# Patient Record
Sex: Male | Born: 1970 | Race: White | Hispanic: No | Marital: Married | Smoking: Former smoker
Health system: Southern US, Community
[De-identification: ages and names within clinical notes are randomized; demographics above are authoritative.]

## PROBLEM LIST (undated history)

## (undated) DIAGNOSIS — K449 Diaphragmatic hernia without obstruction or gangrene: Secondary | ICD-10-CM

## (undated) DIAGNOSIS — R7989 Other specified abnormal findings of blood chemistry: Secondary | ICD-10-CM

## (undated) DIAGNOSIS — IMO0001 Reserved for inherently not codable concepts without codable children: Secondary | ICD-10-CM

## (undated) DIAGNOSIS — F17200 Nicotine dependence, unspecified, uncomplicated: Secondary | ICD-10-CM

## (undated) DIAGNOSIS — E785 Hyperlipidemia, unspecified: Secondary | ICD-10-CM

## (undated) DIAGNOSIS — K219 Gastro-esophageal reflux disease without esophagitis: Secondary | ICD-10-CM

## (undated) DIAGNOSIS — M10071 Idiopathic gout, right ankle and foot: Secondary | ICD-10-CM

## (undated) DIAGNOSIS — I10 Essential (primary) hypertension: Secondary | ICD-10-CM

## (undated) DIAGNOSIS — E559 Vitamin D deficiency, unspecified: Secondary | ICD-10-CM

## (undated) HISTORY — DX: Gastro-esophageal reflux disease without esophagitis: K21.9

## (undated) HISTORY — DX: Reserved for inherently not codable concepts without codable children: IMO0001

## (undated) HISTORY — DX: Idiopathic gout, right ankle and foot: M10.071

## (undated) HISTORY — DX: Hyperlipidemia, unspecified: E78.5

## (undated) HISTORY — DX: Other specified abnormal findings of blood chemistry: R79.89

## (undated) HISTORY — DX: Diaphragmatic hernia without obstruction or gangrene: K44.9

## (undated) HISTORY — DX: Essential (primary) hypertension: I10

## (undated) HISTORY — DX: Vitamin D deficiency, unspecified: E55.9

## (undated) HISTORY — DX: Nicotine dependence, unspecified, uncomplicated: F17.200

---

## 2003-03-22 ENCOUNTER — Emergency Department (HOSPITAL_COMMUNITY): Admission: EM | Admit: 2003-03-22 | Discharge: 2003-03-22 | Payer: Self-pay | Admitting: Emergency Medicine

## 2017-12-03 ENCOUNTER — Other Ambulatory Visit (HOSPITAL_COMMUNITY): Payer: Self-pay | Admitting: Physician Assistant

## 2017-12-03 DIAGNOSIS — R0602 Shortness of breath: Secondary | ICD-10-CM

## 2017-12-10 ENCOUNTER — Other Ambulatory Visit: Payer: Self-pay

## 2017-12-10 ENCOUNTER — Ambulatory Visit (HOSPITAL_COMMUNITY): Payer: 59 | Attending: Cardiovascular Disease

## 2017-12-10 ENCOUNTER — Encounter (INDEPENDENT_AMBULATORY_CARE_PROVIDER_SITE_OTHER): Payer: Self-pay

## 2017-12-10 DIAGNOSIS — R0602 Shortness of breath: Secondary | ICD-10-CM | POA: Diagnosis present

## 2018-02-25 ENCOUNTER — Encounter: Payer: Self-pay | Admitting: Cardiology

## 2018-02-26 ENCOUNTER — Encounter (INDEPENDENT_AMBULATORY_CARE_PROVIDER_SITE_OTHER): Payer: Self-pay

## 2018-02-26 ENCOUNTER — Ambulatory Visit (INDEPENDENT_AMBULATORY_CARE_PROVIDER_SITE_OTHER): Payer: 59 | Admitting: Cardiology

## 2018-02-26 ENCOUNTER — Encounter: Payer: Self-pay | Admitting: Cardiology

## 2018-02-26 VITALS — BP 130/90 | HR 94 | Ht 67.0 in | Wt 177.4 lb

## 2018-02-26 DIAGNOSIS — R9431 Abnormal electrocardiogram [ECG] [EKG]: Secondary | ICD-10-CM

## 2018-02-26 DIAGNOSIS — I10 Essential (primary) hypertension: Secondary | ICD-10-CM | POA: Diagnosis not present

## 2018-02-26 DIAGNOSIS — R0600 Dyspnea, unspecified: Secondary | ICD-10-CM | POA: Diagnosis not present

## 2018-02-26 DIAGNOSIS — R079 Chest pain, unspecified: Secondary | ICD-10-CM | POA: Diagnosis not present

## 2018-02-26 DIAGNOSIS — Z87891 Personal history of nicotine dependence: Secondary | ICD-10-CM

## 2018-02-26 MED ORDER — METOPROLOL TARTRATE 100 MG PO TABS: 100.0000 mg | ORAL_TABLET | Freq: Once | ORAL | 0 refills | Status: DC

## 2018-02-26 NOTE — Patient Instructions (Addendum)
Medication Instructions:  Your physician recommends that you continue on your current medications as directed. Please refer to the Current Medication list given to you today.  Labwork: Your physician recommends that you return for lab work a few days before your cardiac CT is performed.  Testing/Procedures: Cardiac CT  Non-Cardiac CT scanning, (CAT scanning), is a noninvasive, special x-ray that produces cross-sectional images of the body using x-rays and a computer. CT scans help physicians diagnose and treat medical conditions. For some CT exams, a contrast material is used to enhance visibility in the area of the body being studied. CT scans provide greater clarity and reveal more details than regular x-ray exams.   Follow-Up: Your physician recommends that you schedule a follow-up appointment as needed with Dr Anne Fu  Any Other Special Instructions Will Be Listed Below (If Applicable).     If you need a refill on your cardiac medications before your next appointment, please call your pharmacy.   Please arrive at the [facility] main entrance of [facility] at ____________________ AM/PM  [facility] [address] [place], [place] [address] [phone]  Proceed to the [facility] Radiology Department (First Floor).  Please follow these instructions carefully (unless otherwise directed):  Hold all erectile dysfunction medications at least 48 hours prior to test.  On the Night Before the Test: . Be sure to Drink plenty of water. . Do not consume any caffeinated/decaffeinated beverages or chocolate 12 hours prior to your test. . Do not take any antihistamines 12 hours prior to your test.   On the Day of the Test: . Drink plenty of water. Do not drink any water within one hour of the test. . Do not eat any food 4 hours prior to the test. . You may take your regular medications prior to the test.  . Take metoprolol (Lopressor), 100mg  tablet, two hours  prior to test.      After the Test: . Drink plenty of water. . After receiving IV contrast, you may experience a mild flushed feeling. This is normal. . On occasion, you may experience a mild rash up to 24 hours after the test. This is not dangerous. If this occurs, you can take Benadryl 25 mg and increase your fluid intake. . If you experience trouble breathing, this can be serious. If it is severe call 911 IMMEDIATELY. If it is mild, please call our office.

## 2018-02-26 NOTE — Progress Notes (Signed)
Cardiology Office Note:    Date:  02/26/2018   ID:  Blayke Hlavka, DOB Mar 01, 1970, MRN [id number]  PCP:  Heide Scales, PA-C  Cardiologist:  No primary care provider on file.  Electrophysiologist:  None   Referring MD: Kathi Der, MD     History of Present Illness:    Brent Carson is a 48 y.o. male here for the evaluation of dyspnea at the request of Dr. Levora Angel.   He has been experiencing dyspnea off and on.  Hemoglobin was 14.4.  Father had a stroke.  Former smoker quit in 2019.  Used to smoke about 1-1/2 packs/week.  Occasional discomfort in his left shoulder.  SOB, has to stop and gather himself. With or without exertion. Once per day. Going to mail box. [place].   Odd, maybe tightness. Stopped in store had to breath, subside. Duration 1-5 - 10.  minutes.  Syncope 8 years ago x 1. Outdoor music, bright sensation.   Overall he is concerned about the symptoms.  Past Medical History:  Diagnosis Date  . Chronic GERD   . Gastroesophageal reflux disease without esophagitis   . Hiatal hernia   . Hyperlipidemia   . Hypertension   . Idiopathic gout, right ankle and foot   . Low testosterone   . Reflux   . Tobacco dependence   . Vitamin D deficiency     No past surgical history on file.  Current Medications: No outpatient medications have been marked as taking for the 02/26/18 encounter (Office Visit) with Brent Bathe, MD.     Allergies:   Simcor [niacin-simvastatin er]   Social History   Socioeconomic History  . Marital status: Married    Spouse name: Not on file  . Number of children: Not on file  . Years of education: Not on file  . Highest education level: Not on file  Occupational History  . Occupation: [employer]  Social Needs  . Financial resource strain: Not on file  . Food insecurity:    Worry: Not on file    Inability: Not on file  . Transportation needs:    Medical: Not on file    Non-medical: Not on file  Tobacco Use  . Smoking  status: Former Games developer  . Smokeless tobacco: Never Used  Substance and Sexual Activity  . Alcohol use: Yes    Comment: 2 per week  . Drug use: No  . Sexual activity: Not on file  Lifestyle  . Physical activity:    Days per week: Not on file    Minutes per session: Not on file  . Stress: Not on file  Relationships  . Social connections:    Talks on phone: Not on file    Gets together: Not on file    Attends religious service: Not on file    Active member of club or organization: Not on file    Attends meetings of clubs or organizations: Not on file    Relationship status: Not on file  Other Topics Concern  . Not on file  Social History Narrative  . Not on file     Family History: The patient's family history includes CVA in his father; Gout in his father; Hypertension in his father. There is no history of Colon cancer, Colon polyps, or Liver disease.  ROS:   Please see the history of present illness.    Does not eat well he states.  Has exhaustion at times.  Wakes up, short of breath.  All other systems reviewed and are negative.  EKGs/Labs/Other Studies Reviewed:    The following studies were reviewed today: Prior office notes lab work EKG  EKG:  EKG is  ordered today.  The ekg ordered today demonstrates 02/26/2018-sinus rhythm 94 inferior Q waves noted T wave inversion noted in V3 V4 V5 possible anterolateral ischemia.  Recent Labs: No results found for requested labs within last 8760 hours.  Recent Lipid Panel No results found for: CHOL, TRIG, HDL, CHOLHDL, VLDL, LDLCALC, LDLDIRECT  Physical Exam:    VS:  BP 130/90   Pulse 94   Ht 5\' 7"  (1.702 m)   Wt 177 lb 6.4 oz (80.5 kg)   BMI 27.78 kg/m     Wt Readings from Last 3 Encounters:  02/26/18 177 lb 6.4 oz (80.5 kg)     GEN:  Well nourished, well developed in no acute distress HEENT: Normal NECK: No JVD; No carotid bruits LYMPHATICS: No lymphadenopathy CARDIAC: RRR, no murmurs, rubs, gallops RESPIRATORY:   Clear to auscultation without rales, wheezing or rhonchi  ABDOMEN: Soft, non-tender, non-distended MUSCULOSKELETAL:  No edema; No deformity  SKIN: Warm and dry NEUROLOGIC:  Alert and oriented x 3 PSYCHIATRIC:  Normal affect   ASSESSMENT:    1. Dyspnea, unspecified type   2. Chest pain, unspecified type   3. Abnormal EKG   4. Essential hypertension   5. Former smoker    PLAN:    In order of problems listed above:  Dyspnea -may be an anginal equivalent especially with longstanding smoking history, recently quit.  He is concerned about these sensations.  I think it would make sense for [place] to go ahead and proceed with coronary CT with FFR analysis.  He has had an echocardiogram that showed normal ejection fraction.  Blood pressure diastolic mildly elevated.  Continue to treat.   Hyperlipidemia - Atorvastatin 20 mg currently.  Prevention.  Essential hypertension - On benazepril.  Continue work on weight loss.  Diet.  Diastolic elevated today.  Abnormal EKG - T wave inversions noted in the precordial leads.  Inferior Q waves noted however there is no evidence of this on echocardiogram.  Former smoker - This sensation of dyspnea also was accompanying some vaping use at the time.  No longer smoking, no longer vaping.   Medication Adjustments/Labs and Tests Ordered: Current medicines are reviewed at length with the patient today.  Concerns regarding medicines are outlined above.  Orders Placed This Encounter  Procedures  . CT CORONARY MORPH W/CTA COR W/SCORE W/CA W/CM &/OR WO/CM  . CT CORONARY FRACTIONAL FLOW RESERVE DATA PREP  . CT CORONARY FRACTIONAL FLOW RESERVE FLUID ANALYSIS  . Basic metabolic panel  . EKG 12-Lead   Meds ordered this encounter  Medications  . metoprolol tartrate (LOPRESSOR) 100 MG tablet    Sig: Take 1 tablet (100 mg total) by mouth once for 1 dose. Take 2 hours prior to your cardiac CT scan    Dispense:  1 tablet    Refill:  0    Patient  Instructions  Medication Instructions:  Your physician recommends that you continue on your current medications as directed. Please refer to the Current Medication list given to you today.  Labwork: Your physician recommends that you return for lab work a few days before your cardiac CT is performed.  Testing/Procedures: Cardiac CT  Non-Cardiac CT scanning, (CAT scanning), is a noninvasive, special x-ray that produces cross-sectional images of the body using x-rays and a computer. CT scans help  physicians diagnose and treat medical conditions. For some CT exams, a contrast material is used to enhance visibility in the area of the body being studied. CT scans provide greater clarity and reveal more details than regular x-ray exams.   Follow-Up: Your physician recommends that you schedule a follow-up appointment as needed with Dr Anne FuSkains  Any Other Special Instructions Will Be Listed Below (If Applicable).     If you need a refill on your cardiac medications before your next appointment, please call your pharmacy.   Please arrive at the [facility] main entrance of [facility] at ____________________ AM/PM  [facility] [address] [place], [place] [address] [phone]  Proceed to the [facility] Radiology Department (First Floor).  Please follow these instructions carefully (unless otherwise directed):  Hold all erectile dysfunction medications at least 48 hours prior to test.  On the Night Before the Test: . Be sure to Drink plenty of water. . Do not consume any caffeinated/decaffeinated beverages or chocolate 12 hours prior to your test. . Do not take any antihistamines 12 hours prior to your test.   On the Day of the Test: . Drink plenty of water. Do not drink any water within one hour of the test. . Do not eat any food 4 hours prior to the test. . You may take your regular medications prior to the test.  . Take metoprolol (Lopressor), 100mg   tablet, two hours prior to test.      After the Test: . Drink plenty of water. . After receiving IV contrast, you may experience a mild flushed feeling. This is normal. . On occasion, you may experience a mild rash up to 24 hours after the test. This is not dangerous. If this occurs, you can take Benadryl 25 mg and increase your fluid intake. . If you experience trouble breathing, this can be serious. If it is severe call 911 IMMEDIATELY. If it is mild, please call our office.     Signed, Donato SchultzMark Dannis Deroche, MD  02/26/2018 4:38 PM    [facility]

## 2018-03-24 ENCOUNTER — Other Ambulatory Visit: Payer: 59 | Admitting: *Deleted

## 2018-03-24 DIAGNOSIS — R079 Chest pain, unspecified: Secondary | ICD-10-CM

## 2018-03-24 DIAGNOSIS — R0600 Dyspnea, unspecified: Secondary | ICD-10-CM

## 2018-03-25 ENCOUNTER — Telehealth (HOSPITAL_COMMUNITY): Payer: Self-pay | Admitting: Emergency Medicine

## 2018-03-25 LAB — BASIC METABOLIC PANEL
BUN/Creatinine Ratio: 16 (ref 9–20)
BUN: 15 mg/dL (ref 6–24)
CO2: 22 mmol/L (ref 20–29)
Calcium: 9.6 mg/dL (ref 8.7–10.2)
Chloride: 97 mmol/L (ref 96–106)
Creatinine, Ser: 0.91 mg/dL (ref 0.76–1.27)
GFR calc Af Amer: 115 mL/min/{1.73_m2} (ref >59)
GFR calc non Af Amer: 99 mL/min/{1.73_m2} (ref >59)
Glucose: 89 mg/dL (ref 65–99)
POTASSIUM: 4.5 mmol/L (ref 3.5–5.2)
Sodium: 138 mmol/L (ref 134–144)

## 2018-03-25 NOTE — Telephone Encounter (Signed)
Left message on voicemail with name and callback number Gabrian Hoque RN Navigator Cardiac Imaging [facility] [phone] Office [phone] Cell  

## 2018-03-26 ENCOUNTER — Telehealth (HOSPITAL_COMMUNITY): Payer: Self-pay | Admitting: Emergency Medicine

## 2018-03-26 NOTE — Telephone Encounter (Signed)
Returning call after voicemail left--  pt verbalizes understanding of appt date/time, parking situation and where to check in, pre-test NPO status and medications ordered, and verified current allergies; name and call back number provided for further questions should they arise Rockwell Alexandria RN Navigator Cardiac Imaging [facility] [phone] office [phone] cell

## 2018-03-27 ENCOUNTER — Encounter (HOSPITAL_COMMUNITY): Payer: Self-pay

## 2018-03-27 ENCOUNTER — Ambulatory Visit (HOSPITAL_COMMUNITY)
Admission: RE | Admit: 2018-03-27 | Discharge: 2018-03-27 | Disposition: A | Discharge status: Home / Self Care | Payer: 59 | Source: Ambulatory Visit | Attending: Cardiology | Admitting: Cardiology

## 2018-03-27 DIAGNOSIS — R0600 Dyspnea, unspecified: Secondary | ICD-10-CM | POA: Diagnosis not present

## 2018-03-27 DIAGNOSIS — R079 Chest pain, unspecified: Secondary | ICD-10-CM | POA: Diagnosis not present

## 2018-03-27 IMAGING — CT CT HEART MORP W/ CTA COR W/ SCORE W/ CA W/CM &/OR W/O CM
4 of 7 series · 8 of 20 positions shown, 9 images · IV contrast (APPLIED)
Comparison: None.

Addendum:
EXAM:
OVER-READ INTERPRETATION  CT CHEST

The following report is an over-read performed by radiologist Dr.
Lomacenko Boaghe [REDACTED] on 03/28/2018. This
over-read does not include interpretation of cardiac or coronary
anatomy or pathology. The coronary CTA interpretation by the
cardiologist is attached.
CLINICAL DATA: Chest pain
Cardiac CTA
MEDICATIONS:
Sub lingual nitro. 4mg x 2
TECHNIQUE: The patient was scanned on a Siemens [REDACTED]ice scanner. Gantry
rotation speed was 250 msecs. Collimation was 0.6 mm. A 100 kV
prospective scan was triggered in the ascending thoracic aorta at
35-75% of the R-R interval. Average HR during the scan was 60 bpm.
The 3D data set was interpreted on a dedicated work station using
MPR, MIP and VRT modes. A total of 80cc of contrast was used.

[Series 6: best diast 74 % · axial · 0.41mm/px · z∈[-170,-130]mm · 2 of 301 slices shown, 3 images]
[im 101/301  vessel]
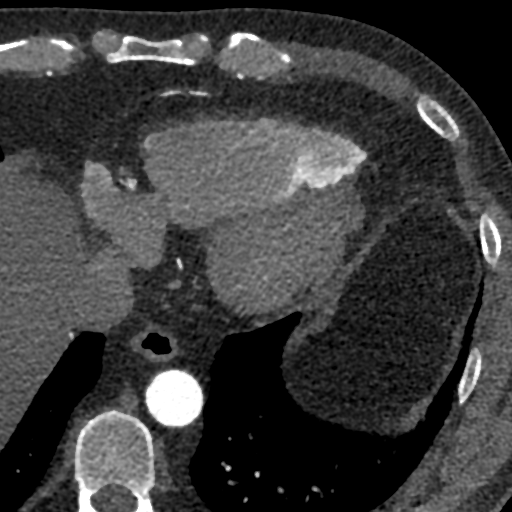
[im 101/301  lung]
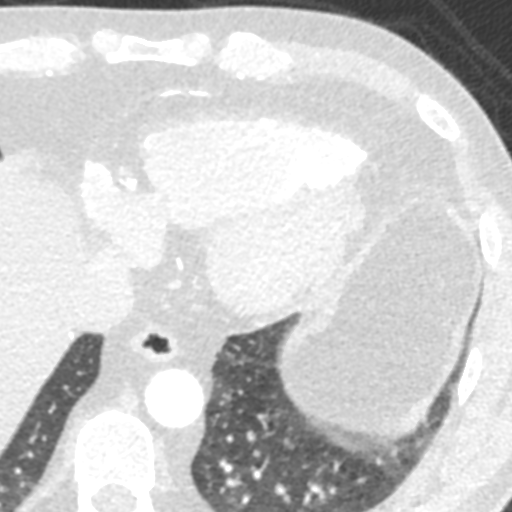
[im 201/301  vessel]
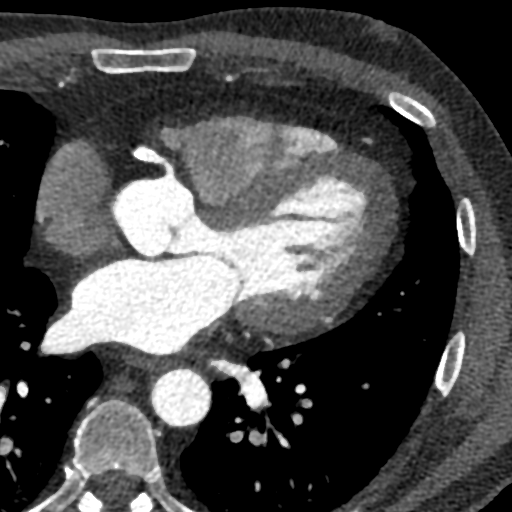

[Series 7: best syst 38 % · axial · 0.41mm/px · z∈[-170,-130]mm · 2 of 301 slices shown]
[im 101/301  vessel]
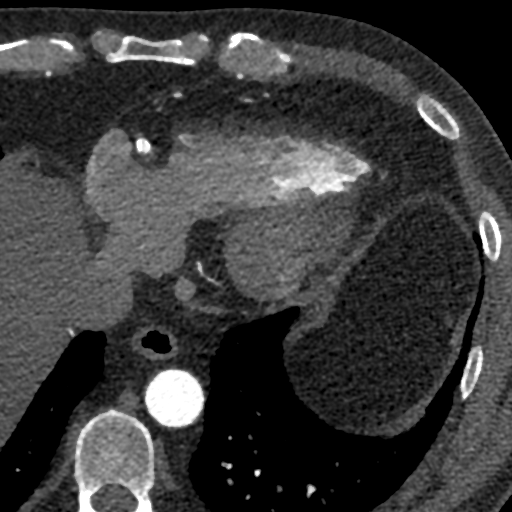
[im 201/301  vessel]
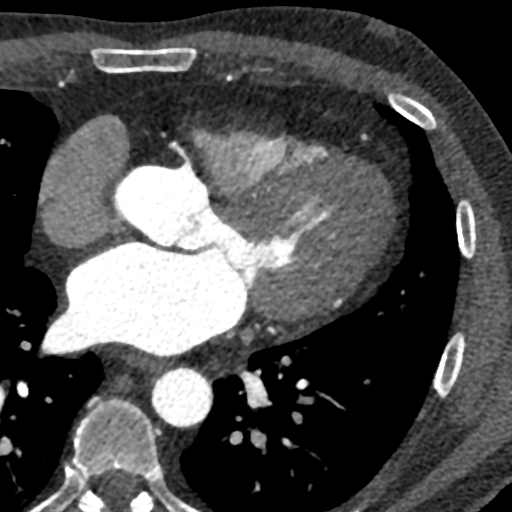

[Series 8: ts diast sharp 74 % · axial · 0.41mm/px · z∈[-170,-130]mm · 2 of 301 slices shown]
[im 101/301  lung]
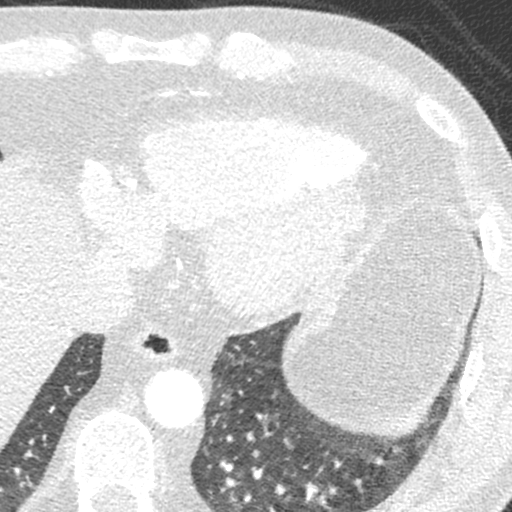
[im 201/301  lung]
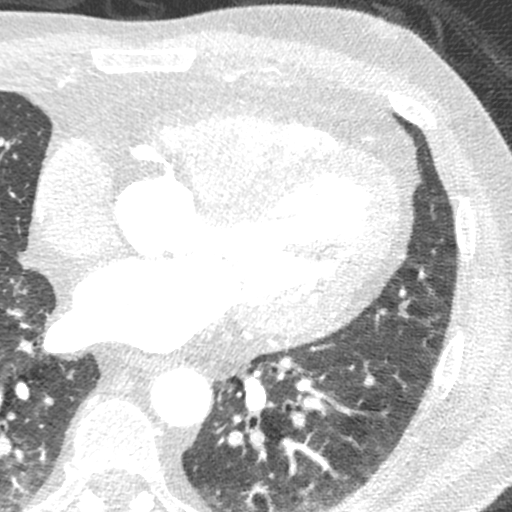

[Series 9: ts syst sharp 38 % · axial · 0.41mm/px · z∈[-170,-130]mm · 2 of 301 slices shown]
[im 101/301  lung]
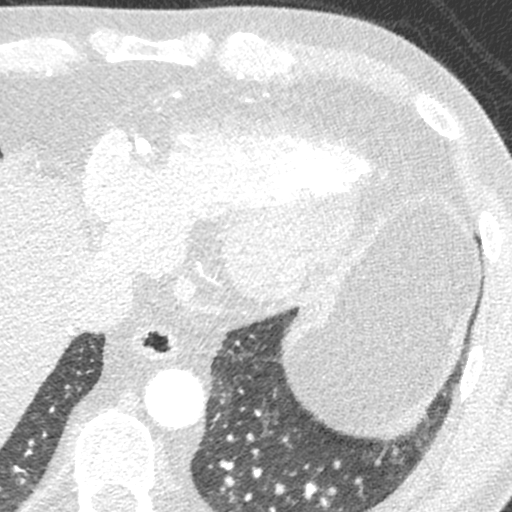
[im 201/301  lung]
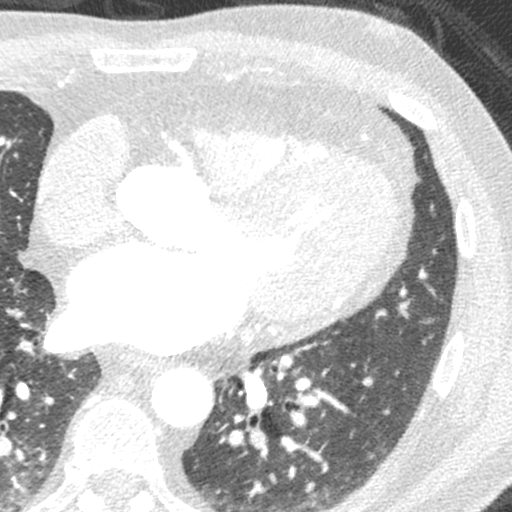

[8 of 20 positions shown; findings below may reference images not displayed]

FINDINGS: Limited view of the lung parenchyma demonstrates no suspicious
nodularity. Airways are normal.

Limited view of the mediastinum demonstrates no adenopathy.
Esophagus normal.

Limited view of the upper abdomen unremarkable.

Limited view of the skeleton and chest wall is unremarkable.
IMPRESSION: No significant extracardiac findings
FINDINGS: Non-cardiac: See separate report from [REDACTED].

Pulmonary veins drain normally to the left atrium.

Calcium Score: 0711 Agatston units.

Coronary Arteries: Right dominant with no anomalies

LM: No plaque or stenosis.

LAD system: Extensive mixed plaque in the proximal LAD, there
appears to be severe (70-90%) stenosis.

Circumflex system: Extensive mixed plaque in the LCx system. Small
OM1 with probably 50% proximal stenosis. Moderate OM2 with mild
(<50%) stenosis proximally. Degree of stenosis in the AV LCx does
not appear to exceed 50%.

RCA system: There is extensive mixed plaque in the mid to distal
RCA. There appears to be critical (>90%) stenosis in the mid RCA and
severe (70-90%) stenosis in the distal RCA.
IMPRESSION: 1. Coronary artery calcium score 0711 Agatston units. This places
the patient in the 99th percentile for age and gender, suggesting
high risk for future cardiac events.

2.  Suspect severe proximal LAD stenosis (70-90%).

3. Suspect critical mid RCA stenosis (>90%) and severe distal RCA
stenosis (70-90%).

Will send for FFR but recommend cardiac cath.

Sancho Larson

*** End of Addendum ***

## 2018-03-27 MED ORDER — NITROGLYCERIN 0.4 MG SL SUBL
0.8000 mg | SUBLINGUAL_TABLET | Freq: Once | SUBLINGUAL | Status: AC
  Administered 2018-03-27: 0.8 mg via SUBLINGUAL
  Filled 2018-03-27: qty 25

## 2018-03-27 MED ORDER — NITROGLYCERIN 0.4 MG SL SUBL
SUBLINGUAL_TABLET | SUBLINGUAL | Status: AC
  Administered 2018-03-27: 0.8 mg via SUBLINGUAL
  Filled 2018-03-27: qty 2

## 2018-03-27 MED ORDER — IOPAMIDOL (ISOVUE-370) INJECTION 76%
80.0000 mL | Freq: Once | INTRAVENOUS | Status: AC | PRN
  Administered 2018-03-27: 80 mL via INTRAVENOUS

## 2018-03-27 NOTE — Discharge Instructions (Addendum)
Cardiac CT Angiogram ° °A cardiac CT angiogram is a procedure to look at the heart and the area around the heart. It may be done to help find the cause of chest pains or other symptoms of heart disease. During this procedure, a large X-ray machine, called a CT scanner, takes detailed pictures of the heart and the surrounding area after a dye (contrast material) has been injected into blood vessels in the area. The procedure is also sometimes called a coronary CT angiogram, coronary artery scanning, or CTA. °A cardiac CT angiogram allows the health care provider to see how well blood is flowing to and from the heart. The health care provider will be able to see if there are any problems, such as: °· Blockage or narrowing of the coronary arteries in the heart. °· Fluid around the heart. °· Signs of weakness or disease in the muscles, valves, and tissues of the heart. °Tell a health care provider about: °· Any allergies you have. This is especially important if you have had a previous allergic reaction to contrast dye. °· All medicines you are taking, including vitamins, herbs, eye drops, creams, and over-the-counter medicines. °· Any blood disorders you have. °· Any surgeries you have had. °· Any medical conditions you have. °· Whether you are pregnant or may be pregnant. °· Any anxiety disorders, chronic pain, or other conditions you have that may increase your stress or prevent you from lying still. °What are the risks? °Generally, this is a safe procedure. However, problems may occur, including: °· Bleeding. °· Infection. °· Allergic reactions to medicines or dyes. °· Damage to other structures or organs. °· Kidney damage from the dye or contrast that is used. °· Increased risk of cancer from radiation exposure. This risk is low. Talk with your health care provider about: °? The risks and benefits of testing. °? How you can receive the lowest dose of radiation. °What happens before the procedure? °· Wear  comfortable clothing and remove any jewelry, glasses, dentures, and hearing aids. °· Follow instructions from your health care provider about eating and drinking. This may include: °? For 12 hours before the test -- avoid caffeine. This includes tea, coffee, soda, energy drinks, and diet pills. Drink plenty of water or other fluids that do not have caffeine in them. Being well-hydrated can prevent complications. °? For 4-6 hours before the test -- stop eating and drinking. The contrast dye can cause nausea, but this is less likely if your stomach is empty. °· Ask your health care provider about changing or stopping your regular medicines. This is especially important if you are taking diabetes medicines, blood thinners, or medicines to treat erectile dysfunction. °What happens during the procedure? °· Hair on your chest may need to be removed so that small sticky patches called electrodes can be placed on your chest. These will transmit information that helps to monitor your heart during the test. °· An IV tube will be inserted into one of your veins. °· You might be given a medicine to control your heart rate during the test. This will help to ensure that good images are obtained. °· You will be asked to lie on an exam table. This table will slide in and out of the CT machine during the procedure. °· Contrast dye will be injected into the IV tube. You might feel warm, or you may get a metallic taste in your mouth. °· You will be given a medicine (nitroglycerin) to relax (dilate) the arteries   a medicine (nitroglycerin) to relax (dilate) the arteries in your heart.  The table that you are lying on will move into the CT machine tunnel for the scan.  The person running the machine will give you instructions while the scans are being done. You may be asked to:  Keep your arms above your head.  Hold your breath.  Stay very still, even if the table is moving.  When the scanning is complete, you will be moved out of the machine.  The IV tube will be removed.  The procedure may vary among health care providers and hospitals.  What happens after  the procedure?  You might feel warm, or you may get a metallic taste in your mouth from the contrast dye.  You may have a headache from the nitroglycerin.  After the procedure, drink water or other fluids to wash (flush) the contrast material out of your body.  Contact a health care provider if you have any symptoms of allergy to the contrast. These symptoms include:  Shortness of breath.  Rash or hives.  A racing heartbeat.  Most people can return to their normal activities right after the procedure. Ask your health care provider what activities are safe for you.  It is up to you to get the results of your procedure. Ask your health care provider, or the department that is doing the procedure, when your results will be ready.  Summary  A cardiac CT angiogram is a procedure to look at the heart and the area around the heart. It may be done to help find the cause of chest pains or other symptoms of heart disease.  During this procedure, a large X-ray machine, called a CT scanner, takes detailed pictures of the heart and the surrounding area after a dye (contrast material) has been injected into blood vessels in the area.  Ask your health care provider about changing or stopping your regular medicines before the procedure. This is especially important if you are taking diabetes medicines, blood thinners, or medicines to treat erectile dysfunction.  After the procedure, drink water or other fluids to wash (flush) the contrast material out of your body.  This information is not intended to replace advice given to you by your health care provider. Make sure you discuss any questions you have with your health care provider.  Document Released: 01/19/2008 Document Revised: 12/26/2015 Document Reviewed: 12/26/2015  Elsevier Interactive Patient Education  2019 [employer]

## 2018-03-27 NOTE — Progress Notes (Signed)
Pt tolerated p[rocedure without incident.  Pt given coke and crqackers post exam.  Denies CP, SOB, dizziness or headache at time of discharge.  Instructions provided about fluid intake post IV contrast administration.  PIV removed and dressing applied.  Pt discharged.

## 2018-03-31 ENCOUNTER — Other Ambulatory Visit: Payer: Self-pay | Admitting: *Deleted

## 2018-03-31 MED ORDER — METOPROLOL SUCCINATE ER 25 MG PO TB24: 25.0000 mg | ORAL_TABLET | Freq: Every day | ORAL | 3 refills | Status: DC

## 2018-03-31 MED ORDER — ATORVASTATIN CALCIUM 40 MG PO TABS: 40.0000 mg | ORAL_TABLET | Freq: Every day | ORAL | 3 refills | Status: AC

## 2018-04-02 ENCOUNTER — Encounter: Payer: Self-pay | Admitting: *Deleted

## 2018-04-02 ENCOUNTER — Encounter: Payer: Self-pay | Admitting: Cardiology

## 2018-04-02 ENCOUNTER — Ambulatory Visit (INDEPENDENT_AMBULATORY_CARE_PROVIDER_SITE_OTHER): Payer: 59 | Admitting: Cardiology

## 2018-04-02 VITALS — BP 106/78 | HR 88 | Ht 67.0 in | Wt 176.4 lb

## 2018-04-02 DIAGNOSIS — I208 Other forms of angina pectoris: Secondary | ICD-10-CM

## 2018-04-02 DIAGNOSIS — R931 Abnormal findings on diagnostic imaging of heart and coronary circulation: Secondary | ICD-10-CM | POA: Diagnosis not present

## 2018-04-02 DIAGNOSIS — Z01812 Encounter for preprocedural laboratory examination: Secondary | ICD-10-CM | POA: Diagnosis not present

## 2018-04-02 DIAGNOSIS — R079 Chest pain, unspecified: Secondary | ICD-10-CM

## 2018-04-02 LAB — CBC
HEMOGLOBIN: 15.3 g/dL (ref 13.0–17.7)
Hematocrit: 44.2 % (ref 37.5–51.0)
MCH: 29.9 pg (ref 26.6–33.0)
MCHC: 34.6 g/dL (ref 31.5–35.7)
MCV: 87 fL (ref 79–97)
Platelets: 305 10*3/uL (ref 150–450)
RBC: 5.11 x10E6/uL (ref 4.14–5.80)
RDW: 12.4 % (ref 11.6–15.4)
WBC: 6.7 10*3/uL (ref 3.4–10.8)

## 2018-04-02 MED ORDER — ASPIRIN EC 81 MG PO TBEC: 81.0000 mg | DELAYED_RELEASE_TABLET | Freq: Every day | ORAL | 3 refills | Status: AC

## 2018-04-02 NOTE — H&P (View-Only) (Signed)
Cardiology Office Note:    Date:  04/02/2018   ID:  Brent Carson, DOB 11/24/1970, MRN [id number]  PCP:  Nelson, Kristen M, PA-C  Cardiologist:  Gurjot Brisco, MD  Electrophysiologist:  None   Referring MD: Nelson, Kristen M, PA-C     History of Present Illness:    Brent Carson is a 47 y.o. male here for the follow-up of abnormal CT scan demonstrating LAD and RCA disease.  He recently was here for evaluation of dyspnea at the request of Dr. Brahmbhatt.   He has been experiencing dyspnea off and on.  Hemoglobin was 14.4.  Father had a stroke.  Former smoker quit in 2019.  Used to smoke about 1-1/2 packs/week.  Occasional discomfort in his left shoulder.  SOB, has to stop and gather himself. With or without exertion. Once per day. Going to mail box. [place].   Odd, maybe tightness. Stopped in store had to breath, subside. Duration 1-5 - 10.  minutes.  Syncope 8 years ago x 1. Outdoor music, bright sensation.   Overall he is concerned about the symptoms.  CT scan did show us LAD and RCA disease with positive FFR analysis.  Showed him pictures personally.  Concerned.  Currently not having any active discomfort.  Denies any bleeding fevers chills nausea vomiting syncope.  Past Medical History:  Diagnosis Date  . Chronic GERD   . Gastroesophageal reflux disease without esophagitis   . Hiatal hernia   . Hyperlipidemia   . Hypertension   . Idiopathic gout, right ankle and foot   . Low testosterone   . Reflux   . Tobacco dependence   . Vitamin D deficiency     History reviewed. No pertinent surgical history.  Current Medications: Current Meds  Medication Sig  . atorvastatin (LIPITOR) 40 MG tablet Take 1 tablet (40 mg total) by mouth daily.  . metoprolol succinate (TOPROL-XL) 25 MG 24 hr tablet Take 1 tablet (25 mg total) by mouth daily.  . pantoprazole (PROTONIX) 40 MG tablet Take 40 mg by mouth daily.     Allergies:   Simcor [niacin-simvastatin er]   Social History    Socioeconomic History  . Marital status: Married    Spouse name: Not on file  . Number of children: Not on file  . Years of education: Not on file  . Highest education level: Not on file  Occupational History  . Occupation: [employer]  Social Needs  . Financial resource strain: Not on file  . Food insecurity:    Worry: Not on file    Inability: Not on file  . Transportation needs:    Medical: Not on file    Non-medical: Not on file  Tobacco Use  . Smoking status: Former Smoker  . Smokeless tobacco: Never Used  Substance and Sexual Activity  . Alcohol use: Yes    Comment: 2 per week  . Drug use: No  . Sexual activity: Not on file  Lifestyle  . Physical activity:    Days per week: Not on file    Minutes per session: Not on file  . Stress: Not on file  Relationships  . Social connections:    Talks on phone: Not on file    Gets together: Not on file    Attends religious service: Not on file    Active member of club or organization: Not on file    Attends meetings of clubs or organizations: Not on file    Relationship status: Not on file    Other Topics Concern  . Not on file  Social History Narrative  . Not on file     Family History: The patient's family history includes CVA in his father; Gout in his father; Hypertension in his father. There is no history of Colon cancer, Colon polyps, or Liver disease.  ROS:   Please see the history of present illness.    Does not eat well he states.  Has exhaustion at times.  Wakes up, short of breath.  All other systems reviewed and are negative.  EKGs/Labs/Other Studies Reviewed:    The following studies were reviewed today: Office notes lab work EKG CT scan.  I personally reviewed his CT scan analysis with him and his wife.  Showed him pictures of FFR analysis.  EKG:  EKG is  ordered today.  The ekg ordered today demonstrates 04/02/2018-sinus rhythm 88 possible inferior infarct pattern T wave inversion noted in the  precordial leads concerning for ischemia.  Personally reviewed 02/26/2018-sinus rhythm 94 inferior Q waves noted T wave inversion noted in V3 V4 V5 possible anterolateral ischemia.  Recent Labs: 03/24/2018: BUN 15; Creatinine, Ser 0.91; Potassium 4.5; Sodium 138  Recent Lipid Panel No results found for: CHOL, TRIG, HDL, CHOLHDL, VLDL, LDLCALC, LDLDIRECT  Physical Exam:    VS:  BP 106/78   Pulse 88   Ht 5\' 7"  (1.702 m)   Wt 176 lb 6.4 oz (80 kg)   SpO2 95%   BMI 27.63 kg/m     Wt Readings from Last 3 Encounters:  04/02/18 176 lb 6.4 oz (80 kg)  02/26/18 177 lb 6.4 oz (80.5 kg)     GEN: Well nourished, well developed, in no acute distress  HEENT: normal  Neck: no JVD, carotid bruits, or masses Cardiac: RRR; no murmurs, rubs, or gallops,no edema  Respiratory:  clear to auscultation bilaterally, normal work of breathing GI: soft, nontender, nondistended, + BS MS: no deformity or atrophy  Skin: warm and dry, no rash Neuro:  Alert and Oriented x 3, Strength and sensation are intact Psych: euthymic mood, full affect   ASSESSMENT:    1. Chest pain, unspecified type   2. Pre-procedure lab exam   3. Abnormal findings diagnostic imaging of heart and coronary circulation   4. Angina decubitus (HCC)    PLAN:    In order of problems listed above:  Dyspnea/angina/abnormal CT with mid LAD and mid to distal RCA stenosis, positive FFR -Prior longstanding smoking history, recently quit.  Generalized malaise.  We will go ahead and proceed with cardiac catheterization.  Risks and benefits of been explained including stroke heart attack death renal impairment bleeding.  He and his wife listened and are willing to proceed.   He has had an echocardiogram that showed normal ejection fraction.  Blood pressure diastolic mildly elevated.  Continue to treat.  He fixes endoscopes. -Aspirin 81 mg, metoprolol 25 mg, atorvastatin 40   Hyperlipidemia - Atorvastatin 20 mg has been increased to 40 mg,  high intensity dose.  Prevention.  Does eat a lot of red meat.  We discussed dietary modifications.  Essential hypertension - On benazepril.  Continue work on weight loss.  Diet.    Abnormal EKG - T wave inversions noted in the precordial leads.  Inferior Q waves noted however there is no evidence of this on echocardiogram.  Former smoker - This sensation of dyspnea also was accompanying some vaping use at the time.  No longer smoking, no longer vaping.  Continue with cessation.  Discussion today regarding catheterization.  Also discussed dietary modification, medication changes.  High risk medical management.    Medication Adjustments/Labs and Tests Ordered: Current medicines are reviewed at length with the patient today.  Concerns regarding medicines are outlined above.  Orders Placed This Encounter  Procedures  . CBC  . EKG 12-Lead   Meds ordered this encounter  Medications  . aspirin EC 81 MG tablet    Sig: Take 1 tablet (81 mg total) by mouth daily.    Dispense:  90 tablet    Refill:  3    Patient Instructions  Medication Instructions:  Please start Aspirin 81 mg daily.  Continue all other medications as listed.  If you need a refill on your cardiac medications before your next appointment, please call your pharmacy.   Lab work: Please have blood work today (CBC)  If you have labs (blood work) drawn today and your tests are completely normal, you will receive your results only by: Marland Kitchen MyChart Message (if you have MyChart) OR . A paper copy in the mail If you have any lab test that is abnormal or we need to change your treatment, we will call you to review the results.  Testing/Procedures: Your physician has requested that you have a cardiac catheterization. Cardiac catheterization is used to diagnose and/or treat various heart conditions. Doctors may recommend this procedure for a number of different reasons. The most common reason is to evaluate chest pain. Chest pain  can be a symptom of coronary artery disease (CAD), and cardiac catheterization can show whether plaque is narrowing or blocking your heart's arteries. This procedure is also used to evaluate the valves, as well as measure the blood flow and oxygen levels in different parts of your heart. For further information please visit [email or web address] Please follow instruction sheet, as given.  Follow-Up: At [facility], you and your health needs are our priority.  As part of our continuing mission to provide you with exceptional heart care, we have created designated Provider Care Teams.  These Care Teams include your primary Cardiologist (physician) and Advanced Practice Providers (APPs -  Physician Assistants and Nurse Practitioners) who all work together to provide you with the care you need, when you need it. You will need a follow up appointment in 2-3 weeks after your cardiac cath.  Please call our office 2 months in advance to schedule this appointment.  You may see Donato Schultz, MD or one of the following Advanced Practice Providers on your designated Care Team:   Norma Fredrickson, NP Nada Boozer, NP . Georgie Chard, NP  Thank you for choosing [facility]!!        Signed, Donato Schultz, MD  04/02/2018 10:46 AM    [facility]

## 2018-04-02 NOTE — Patient Instructions (Addendum)
Medication Instructions:  Please start Aspirin 81 mg daily.  Continue all other medications as listed.  If you need a refill on your cardiac medications before your next appointment, please call your pharmacy.   Lab work: Please have blood work today (CBC)  If you have labs (blood work) drawn today and your tests are completely normal, you will receive your results only by: Marland Kitchen MyChart Message (if you have MyChart) OR . A paper copy in the mail If you have any lab test that is abnormal or we need to change your treatment, we will call you to review the results.  Testing/Procedures: Your physician has requested that you have a cardiac catheterization. Cardiac catheterization is used to diagnose and/or treat various heart conditions. Doctors may recommend this procedure for a number of different reasons. The most common reason is to evaluate chest pain. Chest pain can be a symptom of coronary artery disease (CAD), and cardiac catheterization can show whether plaque is narrowing or blocking your heart's arteries. This procedure is also used to evaluate the valves, as well as measure the blood flow and oxygen levels in different parts of your heart. For further information please visit [email or web address] Please follow instruction sheet, as given.  Follow-Up: At [facility], you and your health needs are our priority.  As part of our continuing mission to provide you with exceptional heart care, we have created designated Provider Care Teams.  These Care Teams include your primary Cardiologist (physician) and Advanced Practice Providers (APPs -  Physician Assistants and Nurse Practitioners) who all work together to provide you with the care you need, when you need it. You will need a follow up appointment in 2-3 weeks after your cardiac cath.  Please call our office 2 months in advance to schedule this appointment.  You may see Donato Schultz, MD or one of the following Advanced Practice Providers on  your designated Care Team:   Norma Fredrickson, NP Nada Boozer, NP . Georgie Chard, NP  Thank you for choosing [facility]!!

## 2018-04-02 NOTE — H&P (View-Only) (Signed)
Cardiology Office Note:    Date:  04/02/2018   ID:  Brent Carson [place], DOB 26-Nov-1970, MRN [id number]  PCP:  Heide ScalesNelson, Kristen M, PA-C  Cardiologist:  Donato SchultzMark Rishikesh Khachatryan, MD  Electrophysiologist:  None   Referring MD: Heide ScalesNelson, Kristen M, PA-C     History of Present Illness:    Brent Carson Gaw is a 48 y.o. male here for the follow-up of abnormal CT scan demonstrating LAD and RCA disease.  He recently was here for evaluation of dyspnea at the request of Dr. Levora AngelBrahmbhatt.   He has been experiencing dyspnea off and on.  Hemoglobin was 14.4.  Father had a stroke.  Former smoker quit in 2019.  Used to smoke about 1-1/2 packs/week.  Occasional discomfort in his left shoulder.  SOB, has to stop and gather himself. With or without exertion. Once per day. Going to mail box. [place].   Odd, maybe tightness. Stopped in store had to breath, subside. Duration 1-5 - 10.  minutes.  Syncope 8 years ago x 1. Outdoor music, bright sensation.   Overall he is concerned about the symptoms.  CT scan did show us LAD and RCA disease with positive FFR analysis.  Showed him pictures personally.  Concerned.  Currently not having any active discomfort.  Denies any bleeding fevers chills nausea vomiting syncope.  Past Medical History:  Diagnosis Date  . Chronic GERD   . Gastroesophageal reflux disease without esophagitis   . Hiatal hernia   . Hyperlipidemia   . Hypertension   . Idiopathic gout, right ankle and foot   . Low testosterone   . Reflux   . Tobacco dependence   . Vitamin D deficiency     History reviewed. No pertinent surgical history.  Current Medications: Current Meds  Medication Sig  . atorvastatin (LIPITOR) 40 MG tablet Take 1 tablet (40 mg total) by mouth daily.  . metoprolol succinate (TOPROL-XL) 25 MG 24 hr tablet Take 1 tablet (25 mg total) by mouth daily.  . pantoprazole (PROTONIX) 40 MG tablet Take 40 mg by mouth daily.     Allergies:   Simcor [niacin-simvastatin er]   Social History    Socioeconomic History  . Marital status: Married    Spouse name: Not on file  . Number of children: Not on file  . Years of education: Not on file  . Highest education level: Not on file  Occupational History  . Occupation: [employer]  Social Needs  . Financial resource strain: Not on file  . Food insecurity:    Worry: Not on file    Inability: Not on file  . Transportation needs:    Medical: Not on file    Non-medical: Not on file  Tobacco Use  . Smoking status: Former Games developermoker  . Smokeless tobacco: Never Used  Substance and Sexual Activity  . Alcohol use: Yes    Comment: 2 per week  . Drug use: No  . Sexual activity: Not on file  Lifestyle  . Physical activity:    Days per week: Not on file    Minutes per session: Not on file  . Stress: Not on file  Relationships  . Social connections:    Talks on phone: Not on file    Gets together: Not on file    Attends religious service: Not on file    Active member of club or organization: Not on file    Attends meetings of clubs or organizations: Not on file    Relationship status: Not on file  Other Topics Concern  . Not on file  Social History Narrative  . Not on file     Family History: The patient's family history includes CVA in his father; Gout in his father; Hypertension in his father. There is no history of Colon cancer, Colon polyps, or Liver disease.  ROS:   Please see the history of present illness.    Does not eat well he states.  Has exhaustion at times.  Wakes up, short of breath.  All other systems reviewed and are negative.  EKGs/Labs/Other Studies Reviewed:    The following studies were reviewed today: Office notes lab work EKG CT scan.  I personally reviewed his CT scan analysis with him and his wife.  Showed him pictures of FFR analysis.  EKG:  EKG is  ordered today.  The ekg ordered today demonstrates 04/02/2018-sinus rhythm 88 possible inferior infarct pattern T wave inversion noted in the  precordial leads concerning for ischemia.  Personally reviewed 02/26/2018-sinus rhythm 94 inferior Q waves noted T wave inversion noted in V3 V4 V5 possible anterolateral ischemia.  Recent Labs: 03/24/2018: BUN 15; Creatinine, Ser 0.91; Potassium 4.5; Sodium 138  Recent Lipid Panel No results found for: CHOL, TRIG, HDL, CHOLHDL, VLDL, LDLCALC, LDLDIRECT  Physical Exam:    VS:  BP 106/78   Pulse 88   Ht 5\' 7"  (1.702 m)   Wt 176 lb 6.4 oz (80 kg)   SpO2 95%   BMI 27.63 kg/m     Wt Readings from Last 3 Encounters:  04/02/18 176 lb 6.4 oz (80 kg)  02/26/18 177 lb 6.4 oz (80.5 kg)     GEN: Well nourished, well developed, in no acute distress  HEENT: normal  Neck: no JVD, carotid bruits, or masses Cardiac: RRR; no murmurs, rubs, or gallops,no edema  Respiratory:  clear to auscultation bilaterally, normal work of breathing GI: soft, nontender, nondistended, + BS MS: no deformity or atrophy  Skin: warm and dry, no rash Neuro:  Alert and Oriented x 3, Strength and sensation are intact Psych: euthymic mood, full affect   ASSESSMENT:    1. Chest pain, unspecified type   2. Pre-procedure lab exam   3. Abnormal findings diagnostic imaging of heart and coronary circulation   4. Angina decubitus (HCC)    PLAN:    In order of problems listed above:  Dyspnea/angina/abnormal CT with mid LAD and mid to distal RCA stenosis, positive FFR -Prior longstanding smoking history, recently quit.  Generalized malaise.  We will go ahead and proceed with cardiac catheterization.  Risks and benefits of been explained including stroke heart attack death renal impairment bleeding.  He and his wife listened and are willing to proceed.   He has had an echocardiogram that showed normal ejection fraction.  Blood pressure diastolic mildly elevated.  Continue to treat.  He fixes endoscopes. -Aspirin 81 mg, metoprolol 25 mg, atorvastatin 40   Hyperlipidemia - Atorvastatin 20 mg has been increased to 40 mg,  high intensity dose.  Prevention.  Does eat a lot of red meat.  We discussed dietary modifications.  Essential hypertension - On benazepril.  Continue work on weight loss.  Diet.    Abnormal EKG - T wave inversions noted in the precordial leads.  Inferior Q waves noted however there is no evidence of this on echocardiogram.  Former smoker - This sensation of dyspnea also was accompanying some vaping use at the time.  No longer smoking, no longer vaping.  Continue with cessation.  Discussion today regarding catheterization.  Also discussed dietary modification, medication changes.  High risk medical management.    Medication Adjustments/Labs and Tests Ordered: Current medicines are reviewed at length with the patient today.  Concerns regarding medicines are outlined above.  Orders Placed This Encounter  Procedures  . CBC  . EKG 12-Lead   Meds ordered this encounter  Medications  . aspirin EC 81 MG tablet    Sig: Take 1 tablet (81 mg total) by mouth daily.    Dispense:  90 tablet    Refill:  3    Patient Instructions  Medication Instructions:  Please start Aspirin 81 mg daily.  Continue all other medications as listed.  If you need a refill on your cardiac medications before your next appointment, please call your pharmacy.   Lab work: Please have blood work today (CBC)  If you have labs (blood work) drawn today and your tests are completely normal, you will receive your results only by: Marland Kitchen MyChart Message (if you have MyChart) OR . A paper copy in the mail If you have any lab test that is abnormal or we need to change your treatment, we will call you to review the results.  Testing/Procedures: Your physician has requested that you have a cardiac catheterization. Cardiac catheterization is used to diagnose and/or treat various heart conditions. Doctors may recommend this procedure for a number of different reasons. The most common reason is to evaluate chest pain. Chest pain  can be a symptom of coronary artery disease (CAD), and cardiac catheterization can show whether plaque is narrowing or blocking your heart's arteries. This procedure is also used to evaluate the valves, as well as measure the blood flow and oxygen levels in different parts of your heart. For further information please visit [email or web address] Please follow instruction sheet, as given.  Follow-Up: At [facility], you and your health needs are our priority.  As part of our continuing mission to provide you with exceptional heart care, we have created designated Provider Care Teams.  These Care Teams include your primary Cardiologist (physician) and Advanced Practice Providers (APPs -  Physician Assistants and Nurse Practitioners) who all work together to provide you with the care you need, when you need it. You will need a follow up appointment in 2-3 weeks after your cardiac cath.  Please call our office 2 months in advance to schedule this appointment.  You may see Donato Schultz, MD or one of the following Advanced Practice Providers on your designated Care Team:   Norma Fredrickson, NP Nada Boozer, NP . Georgie Chard, NP  Thank you for choosing [facility]!!        Signed, Donato Schultz, MD  04/02/2018 10:46 AM    [facility]

## 2018-04-02 NOTE — Progress Notes (Signed)
Cardiology Office Note:    Date:  04/02/2018   ID:  Brent Carson, DOB 08/06/1970, MRN [id number]  PCP:  Nelson, Kristen M, PA-C  Cardiologist:   , MD  Electrophysiologist:  None   Referring MD: Nelson, Kristen M, PA-C     History of Present Illness:    Brent Carson is a 47 y.o. male here for the follow-up of abnormal CT scan demonstrating LAD and RCA disease.  He recently was here for evaluation of dyspnea at the request of Dr. Brahmbhatt.   He has been experiencing dyspnea off and on.  Hemoglobin was 14.4.  Father had a stroke.  Former smoker quit in 2019.  Used to smoke about 1-1/2 packs/week.  Occasional discomfort in his left shoulder.  SOB, has to stop and gather himself. With or without exertion. Once per day. Going to mail box. [place].   Odd, maybe tightness. Stopped in store had to breath, subside. Duration 1-5 - 10.  minutes.  Syncope 8 years ago x 1. Outdoor music, bright sensation.   Overall he is concerned about the symptoms.  CT scan did show us LAD and RCA disease with positive FFR analysis.  Showed him pictures personally.  Concerned.  Currently not having any active discomfort.  Denies any bleeding fevers chills nausea vomiting syncope.  Past Medical History:  Diagnosis Date  . Chronic GERD   . Gastroesophageal reflux disease without esophagitis   . Hiatal hernia   . Hyperlipidemia   . Hypertension   . Idiopathic gout, right ankle and foot   . Low testosterone   . Reflux   . Tobacco dependence   . Vitamin D deficiency     History reviewed. No pertinent surgical history.  Current Medications: Current Meds  Medication Sig  . atorvastatin (LIPITOR) 40 MG tablet Take 1 tablet (40 mg total) by mouth daily.  . metoprolol succinate (TOPROL-XL) 25 MG 24 hr tablet Take 1 tablet (25 mg total) by mouth daily.  . pantoprazole (PROTONIX) 40 MG tablet Take 40 mg by mouth daily.     Allergies:   Simcor [niacin-simvastatin er]   Social History    Socioeconomic History  . Marital status: Married    Spouse name: Not on file  . Number of children: Not on file  . Years of education: Not on file  . Highest education level: Not on file  Occupational History  . Occupation: [employer]  Social Needs  . Financial resource strain: Not on file  . Food insecurity:    Worry: Not on file    Inability: Not on file  . Transportation needs:    Medical: Not on file    Non-medical: Not on file  Tobacco Use  . Smoking status: Former Smoker  . Smokeless tobacco: Never Used  Substance and Sexual Activity  . Alcohol use: Yes    Comment: 2 per week  . Drug use: No  . Sexual activity: Not on file  Lifestyle  . Physical activity:    Days per week: Not on file    Minutes per session: Not on file  . Stress: Not on file  Relationships  . Social connections:    Talks on phone: Not on file    Gets together: Not on file    Attends religious service: Not on file    Active member of club or organization: Not on file    Attends meetings of clubs or organizations: Not on file    Relationship status: Not on file    Other Topics Concern  . Not on file  Social History Narrative  . Not on file     Family History: The patient's family history includes CVA in his father; Gout in his father; Hypertension in his father. There is no history of Colon cancer, Colon polyps, or Liver disease.  ROS:   Please see the history of present illness.    Does not eat well he states.  Has exhaustion at times.  Wakes up, short of breath.  All other systems reviewed and are negative.  EKGs/Labs/Other Studies Reviewed:    The following studies were reviewed today: Office notes lab work EKG CT scan.  I personally reviewed his CT scan analysis with him and his wife.  Showed him pictures of FFR analysis.  EKG:  EKG is  ordered today.  The ekg ordered today demonstrates 04/02/2018-sinus rhythm 88 possible inferior infarct pattern T wave inversion noted in the  precordial leads concerning for ischemia.  Personally reviewed 02/26/2018-sinus rhythm 94 inferior Q waves noted T wave inversion noted in V3 V4 V5 possible anterolateral ischemia.  Recent Labs: 03/24/2018: BUN 15; Creatinine, Ser 0.91; Potassium 4.5; Sodium 138  Recent Lipid Panel No results found for: CHOL, TRIG, HDL, CHOLHDL, VLDL, LDLCALC, LDLDIRECT  Physical Exam:    VS:  BP 106/78   Pulse 88   Ht 5\' 7"  (1.702 m)   Wt 176 lb 6.4 oz (80 kg)   SpO2 95%   BMI 27.63 kg/m     Wt Readings from Last 3 Encounters:  04/02/18 176 lb 6.4 oz (80 kg)  02/26/18 177 lb 6.4 oz (80.5 kg)     GEN: Well nourished, well developed, in no acute distress  HEENT: normal  Neck: no JVD, carotid bruits, or masses Cardiac: RRR; no murmurs, rubs, or gallops,no edema  Respiratory:  clear to auscultation bilaterally, normal work of breathing GI: soft, nontender, nondistended, + BS MS: no deformity or atrophy  Skin: warm and dry, no rash Neuro:  Alert and Oriented x 3, Strength and sensation are intact Psych: euthymic mood, full affect   ASSESSMENT:    1. Chest pain, unspecified type   2. Pre-procedure lab exam   3. Abnormal findings diagnostic imaging of heart and coronary circulation   4. Angina decubitus (HCC)    PLAN:    In order of problems listed above:  Dyspnea/angina/abnormal CT with mid LAD and mid to distal RCA stenosis, positive FFR -Prior longstanding smoking history, recently quit.  Generalized malaise.  We will go ahead and proceed with cardiac catheterization.  Risks and benefits of been explained including stroke heart attack death renal impairment bleeding.  He and his wife listened and are willing to proceed.   He has had an echocardiogram that showed normal ejection fraction.  Blood pressure diastolic mildly elevated.  Continue to treat.  He fixes endoscopes. -Aspirin 81 mg, metoprolol 25 mg, atorvastatin 40   Hyperlipidemia - Atorvastatin 20 mg has been increased to 40 mg,  high intensity dose.  Prevention.  Does eat a lot of red meat.  We discussed dietary modifications.  Essential hypertension - On benazepril.  Continue work on weight loss.  Diet.    Abnormal EKG - T wave inversions noted in the precordial leads.  Inferior Q waves noted however there is no evidence of this on echocardiogram.  Former smoker - This sensation of dyspnea also was accompanying some vaping use at the time.  No longer smoking, no longer vaping.  Continue with cessation.  Discussion today regarding catheterization.  Also discussed dietary modification, medication changes.  High risk medical management.    Medication Adjustments/Labs and Tests Ordered: Current medicines are reviewed at length with the patient today.  Concerns regarding medicines are outlined above.  Orders Placed This Encounter  Procedures  . CBC  . EKG 12-Lead   Meds ordered this encounter  Medications  . aspirin EC 81 MG tablet    Sig: Take 1 tablet (81 mg total) by mouth daily.    Dispense:  90 tablet    Refill:  3    Patient Instructions  Medication Instructions:  Please start Aspirin 81 mg daily.  Continue all other medications as listed.  If you need a refill on your cardiac medications before your next appointment, please call your pharmacy.   Lab work: Please have blood work today (CBC)  If you have labs (blood work) drawn today and your tests are completely normal, you will receive your results only by: Marland Kitchen MyChart Message (if you have MyChart) OR . A paper copy in the mail If you have any lab test that is abnormal or we need to change your treatment, we will call you to review the results.  Testing/Procedures: Your physician has requested that you have a cardiac catheterization. Cardiac catheterization is used to diagnose and/or treat various heart conditions. Doctors may recommend this procedure for a number of different reasons. The most common reason is to evaluate chest pain. Chest pain  can be a symptom of coronary artery disease (CAD), and cardiac catheterization can show whether plaque is narrowing or blocking your heart's arteries. This procedure is also used to evaluate the valves, as well as measure the blood flow and oxygen levels in different parts of your heart. For further information please visit [email or web address] Please follow instruction sheet, as given.  Follow-Up: At [facility], you and your health needs are our priority.  As part of our continuing mission to provide you with exceptional heart care, we have created designated Provider Care Teams.  These Care Teams include your primary Cardiologist (physician) and Advanced Practice Providers (APPs -  Physician Assistants and Nurse Practitioners) who all work together to provide you with the care you need, when you need it. You will need a follow up appointment in 2-3 weeks after your cardiac cath.  Please call our office 2 months in advance to schedule this appointment.  You may see Donato Schultz, MD or one of the following Advanced Practice Providers on your designated Care Team:   Norma Fredrickson, NP Nada Boozer, NP . Georgie Chard, NP  Thank you for choosing [facility]!!        Signed, Donato Schultz, MD  04/02/2018 10:46 AM    [facility]

## 2018-04-03 ENCOUNTER — Telehealth: Payer: Self-pay | Admitting: *Deleted

## 2018-04-03 NOTE — Telephone Encounter (Signed)
I reviewed instructions with patient, he verbalized understanding,thanked me for call. 

## 2018-04-03 NOTE — Telephone Encounter (Signed)
Pt contacted pre-catheterization scheduled at [facility] for: Monday April 07, 2018 7:30 AM Verified arrival time and place: [facility] Main Entrance A at: 5:30 AM  No solid food after midnight prior to cath, clear liquids until 5 AM day of procedure. Contrast allergy: no Verify no diabetes medications:  AM meds can be  taken pre-cath with sip of water including: ASA 81 mg  Confirm patient has responsible person to drive home post procedure and observe 24 hours after arriving home.  LMTCB to review instructions with patient-remind patient to take aspirin morning of procedure.

## 2018-04-07 ENCOUNTER — Telehealth: Payer: Self-pay | Admitting: Internal Medicine

## 2018-04-07 ENCOUNTER — Other Ambulatory Visit: Payer: Self-pay

## 2018-04-07 ENCOUNTER — Encounter (HOSPITAL_COMMUNITY)
Admission: RE | Disposition: A | Discharge status: Home / Self Care | Payer: Self-pay | Source: Home / Self Care | Attending: Internal Medicine

## 2018-04-07 ENCOUNTER — Ambulatory Visit (HOSPITAL_COMMUNITY)
Admission: RE | Admit: 2018-04-07 | Discharge: 2018-04-07 | Disposition: A | Discharge status: Home / Self Care | Payer: No Typology Code available for payment source | Attending: Internal Medicine | Admitting: Internal Medicine

## 2018-04-07 DIAGNOSIS — I25118 Atherosclerotic heart disease of native coronary artery with other forms of angina pectoris: Secondary | ICD-10-CM | POA: Insufficient documentation

## 2018-04-07 DIAGNOSIS — E785 Hyperlipidemia, unspecified: Secondary | ICD-10-CM | POA: Insufficient documentation

## 2018-04-07 DIAGNOSIS — Z8249 Family history of ischemic heart disease and other diseases of the circulatory system: Secondary | ICD-10-CM | POA: Insufficient documentation

## 2018-04-07 DIAGNOSIS — I2582 Chronic total occlusion of coronary artery: Secondary | ICD-10-CM | POA: Insufficient documentation

## 2018-04-07 DIAGNOSIS — I2510 Atherosclerotic heart disease of native coronary artery without angina pectoris: Secondary | ICD-10-CM

## 2018-04-07 DIAGNOSIS — Z888 Allergy status to other drugs, medicaments and biological substances status: Secondary | ICD-10-CM | POA: Diagnosis not present

## 2018-04-07 DIAGNOSIS — K219 Gastro-esophageal reflux disease without esophagitis: Secondary | ICD-10-CM | POA: Insufficient documentation

## 2018-04-07 DIAGNOSIS — Z87891 Personal history of nicotine dependence: Secondary | ICD-10-CM | POA: Insufficient documentation

## 2018-04-07 DIAGNOSIS — Z823 Family history of stroke: Secondary | ICD-10-CM | POA: Insufficient documentation

## 2018-04-07 DIAGNOSIS — I2584 Coronary atherosclerosis due to calcified coronary lesion: Secondary | ICD-10-CM | POA: Diagnosis not present

## 2018-04-07 DIAGNOSIS — R931 Abnormal findings on diagnostic imaging of heart and coronary circulation: Secondary | ICD-10-CM | POA: Diagnosis not present

## 2018-04-07 DIAGNOSIS — Z79899 Other long term (current) drug therapy: Secondary | ICD-10-CM | POA: Diagnosis not present

## 2018-04-07 DIAGNOSIS — R079 Chest pain, unspecified: Secondary | ICD-10-CM | POA: Diagnosis present

## 2018-04-07 DIAGNOSIS — R5383 Other fatigue: Secondary | ICD-10-CM | POA: Diagnosis not present

## 2018-04-07 DIAGNOSIS — I10 Essential (primary) hypertension: Secondary | ICD-10-CM | POA: Insufficient documentation

## 2018-04-07 DIAGNOSIS — I208 Other forms of angina pectoris: Secondary | ICD-10-CM | POA: Diagnosis present

## 2018-04-07 DIAGNOSIS — I2089 Other forms of angina pectoris: Secondary | ICD-10-CM | POA: Diagnosis present

## 2018-04-07 HISTORY — PX: LEFT HEART CATH AND CORONARY ANGIOGRAPHY: CATH118249

## 2018-04-07 SURGERY — LEFT HEART CATH AND CORONARY ANGIOGRAPHY
Anesthesia: LOCAL

## 2018-04-07 MED ORDER — SODIUM CHLORIDE 0.9 % IV SOLN: 250.0000 mL | INTRAVENOUS | Status: DC | PRN

## 2018-04-07 MED ORDER — LIDOCAINE HCL (PF) 1 % IJ SOLN
INTRAMUSCULAR | Status: AC
  Filled 2018-04-07: qty 30

## 2018-04-07 MED ORDER — HEPARIN SODIUM (PORCINE) 1000 UNIT/ML IJ SOLN
INTRAMUSCULAR | Status: DC | PRN
  Administered 2018-04-07: 4000 [IU] via INTRAVENOUS

## 2018-04-07 MED ORDER — HEPARIN (PORCINE) IN NACL 1000-0.9 UT/500ML-% IV SOLN
INTRAVENOUS | Status: AC
  Filled 2018-04-07: qty 500

## 2018-04-07 MED ORDER — FENTANYL CITRATE (PF) 100 MCG/2ML IJ SOLN
INTRAMUSCULAR | Status: AC
  Filled 2018-04-07: qty 2

## 2018-04-07 MED ORDER — SODIUM CHLORIDE 0.9 % IV SOLN: INTRAVENOUS | Status: DC

## 2018-04-07 MED ORDER — VERAPAMIL HCL 2.5 MG/ML IV SOLN
INTRAVENOUS | Status: DC | PRN
  Administered 2018-04-07: 10 mL via INTRA_ARTERIAL

## 2018-04-07 MED ORDER — ACETAMINOPHEN 325 MG PO TABS: 650.0000 mg | ORAL_TABLET | ORAL | Status: DC | PRN

## 2018-04-07 MED ORDER — SODIUM CHLORIDE 0.9% FLUSH: 3.0000 mL | INTRAVENOUS | Status: DC | PRN

## 2018-04-07 MED ORDER — HEPARIN SODIUM (PORCINE) 1000 UNIT/ML IJ SOLN
INTRAMUSCULAR | Status: AC
  Filled 2018-04-07: qty 1

## 2018-04-07 MED ORDER — ISOSORBIDE MONONITRATE ER 30 MG PO TB24: 30.0000 mg | ORAL_TABLET | Freq: Every day | ORAL | 11 refills | Status: DC

## 2018-04-07 MED ORDER — MIDAZOLAM HCL 2 MG/2ML IJ SOLN
INTRAMUSCULAR | Status: DC | PRN
  Administered 2018-04-07: 1 mg via INTRAVENOUS

## 2018-04-07 MED ORDER — LIDOCAINE HCL (PF) 1 % IJ SOLN
INTRAMUSCULAR | Status: DC | PRN
  Administered 2018-04-07: 2 mL

## 2018-04-07 MED ORDER — NITROGLYCERIN 0.4 MG SL SUBL: 0.4000 mg | SUBLINGUAL_TABLET | SUBLINGUAL | 99 refills | Status: DC | PRN

## 2018-04-07 MED ORDER — SODIUM CHLORIDE 0.9 % WEIGHT BASED INFUSION: 1.0000 mL/kg/h | INTRAVENOUS | Status: DC

## 2018-04-07 MED ORDER — ONDANSETRON HCL 4 MG/2ML IJ SOLN: 4.0000 mg | Freq: Four times a day (QID) | INTRAMUSCULAR | Status: DC | PRN

## 2018-04-07 MED ORDER — SODIUM CHLORIDE 0.9 % WEIGHT BASED INFUSION
3.0000 mL/kg/h | INTRAVENOUS | Status: AC
  Administered 2018-04-07: 3 mL/kg/h via INTRAVENOUS

## 2018-04-07 MED ORDER — IOHEXOL 350 MG/ML SOLN
INTRAVENOUS | Status: DC | PRN
  Administered 2018-04-07: 60 mL via INTRA_ARTERIAL

## 2018-04-07 MED ORDER — ASPIRIN 81 MG PO CHEW: 81.0000 mg | CHEWABLE_TABLET | ORAL | Status: DC

## 2018-04-07 MED ORDER — FENTANYL CITRATE (PF) 100 MCG/2ML IJ SOLN
INTRAMUSCULAR | Status: DC | PRN
  Administered 2018-04-07: 50 ug via INTRAVENOUS

## 2018-04-07 MED ORDER — HEPARIN (PORCINE) IN NACL 1000-0.9 UT/500ML-% IV SOLN
INTRAVENOUS | Status: DC | PRN
  Administered 2018-04-07 (×2): 500 mL

## 2018-04-07 MED ORDER — NITROGLYCERIN 0.4 MG SL SUBL: 0.4000 mg | SUBLINGUAL_TABLET | SUBLINGUAL | 1 refills | Status: AC | PRN

## 2018-04-07 MED ORDER — VERAPAMIL HCL 2.5 MG/ML IV SOLN
INTRAVENOUS | Status: AC
  Filled 2018-04-07: qty 2

## 2018-04-07 MED ORDER — MIDAZOLAM HCL 2 MG/2ML IJ SOLN
INTRAMUSCULAR | Status: AC
  Filled 2018-04-07: qty 2

## 2018-04-07 MED ORDER — SODIUM CHLORIDE 0.9% FLUSH: 3.0000 mL | Freq: Two times a day (BID) | INTRAVENOUS | Status: DC

## 2018-04-07 SURGICAL SUPPLY — 10 items
CATH 5FR JL3.5 JR4 ANG PIG MP (CATHETERS) ×2 IMPLANT
DEVICE RAD COMP TR BAND LRG (VASCULAR PRODUCTS) ×2 IMPLANT
GLIDESHEATH SLEND SS 6F .021 (SHEATH) ×2 IMPLANT
GUIDEWIRE INQWIRE 1.5J.035X260 (WIRE) ×1 IMPLANT
INQWIRE 1.5J .035X260CM (WIRE) ×2
KIT HEART LEFT (KITS) ×2 IMPLANT
PACK CARDIAC CATHETERIZATION (CUSTOM PROCEDURE TRAY) ×2 IMPLANT
SYR MEDRAD MARK 7 150ML (SYRINGE) ×2 IMPLANT
TRANSDUCER W/STOPCOCK (MISCELLANEOUS) ×2 IMPLANT
TUBING CIL FLEX 10 FLL-RA (TUBING) ×2 IMPLANT

## 2018-04-07 NOTE — Progress Notes (Signed)
Arm splint placed to right wrist/ Arm area Ambulated to bathroom to void  tol well

## 2018-04-07 NOTE — Interval H&P Note (Signed)
History and Physical Interval Note:  04/07/2018 7:02 AM  Brent Carson  has presented today for cardiac catheterization, with the diagnosis of stable angina and abnormal cardiac CT. The various methods of treatment have been discussed with the patient and family. After consideration of risks, benefits and other options for treatment, the patient has consented to  Procedure(s): LEFT HEART CATH AND CORONARY ANGIOGRAPHY (N/A) as a surgical intervention .  The patient's history has been reviewed, patient examined, no change in status, stable for surgery.  I have reviewed the patient's chart and labs.  Questions were answered to the patient's satisfaction.    Cath Lab Visit (complete for each Cath Lab visit)  Clinical Evaluation Leading to the Procedure:   ACS: No.  Non-ACS:    Anginal Classification: CCS III  Anti-ischemic medical therapy: Minimal Therapy (1 class of medications)  Non-Invasive Test Results: High-risk stress test findings: cardiac mortality >3%/year (severe 2-vessel CAD by cardiac CTA)  Prior CABG: No previous CABG  Brent Carson

## 2018-04-07 NOTE — Telephone Encounter (Signed)
Spoke with patient's wife, ok per DPR. She expressed that patient is very anxious about the report he received today from the result of the heart cath. They are asking for anxiety medication. Patient is very nervous about it taking a while to schedule the next appointments and surgery. He is not having chest pain at this time and is not having any trouble breathing. He is emotionally upset. I encouraged her to encourage him to relax tonight and that tomorrow we should began the process of arranging referrals and such as recommended by Dr End or his primary cardiologist, Dr Anne Fu. She was appreciative. I assured her that this is a process but we will be sure to keep them as informed as we can. She expressed that the patient and her are just anxious to have the appointments set up and all so they can move forward. I advised I will make Dr End aware of their concerns and we will be in touch with next steps as soon as possible.

## 2018-04-07 NOTE — Telephone Encounter (Signed)
I reviewed images of today's catheterization with Drs. Cooper (IC) and [facility] (CT surgery).  Both PCI to the LAD and CABG are reasonable options.  I have also discussed the case with Dr. Anne Fu.  I spoke with Mr. Papale at length regarding today's cath findings as well as the details of CABG, PCI, and medical therapy.  Mr. Sloman would like to proceed with PCI to the LAD as soon as possible.  We will attempt to arrange for this with me on Friday (2/21).  I will defer loading him with a P2Y12 inhibitor for now in case he changes his mind regarding CABG vs. PCI.  Yvonne Kendall, MD [facility] Pager: [phone]

## 2018-04-07 NOTE — Discharge Instructions (Signed)
Drink plenty of fluids  °Keep right arm at or above heart level.  °Radial Site Care ° °This sheet gives you information about how to care for yourself after your procedure. Your health care provider may also give you more specific instructions. If you have problems or questions, contact your health care provider. °What can I expect after the procedure? °After the procedure, it is common to have: °· Bruising and tenderness at the catheter insertion area. °Follow these instructions at home: °Medicines °· Take over-the-counter and prescription medicines only as told by your health care provider. °Insertion site care °· Follow instructions from your health care provider about how to take care of your insertion site. Make sure you: °? Wash your hands with soap and water before you change your bandage (dressing). If soap and water are not available, use hand sanitizer. °? Change your dressing as told by your health care provider. °? Leave stitches (sutures), skin glue, or adhesive strips in place. These skin closures may need to stay in place for 2 weeks or longer. If adhesive strip edges start to loosen and curl up, you may trim the loose edges. Do not remove adhesive strips completely unless your health care provider tells you to do that. °· Check your insertion site every day for signs of infection. Check for: °? Redness, swelling, or pain. °? Fluid or blood. °? Pus or a bad smell. °? Warmth. °· Do not take baths, swim, or use a hot tub until your health care provider approves. °· You may shower 24-48 hours after the procedure, or as directed by your health care provider. °? Remove the dressing and gently wash the site with plain soap and water. °? Pat the area dry with a clean towel. °? Do not rub the site. That could cause bleeding. °· Do not apply powder or lotion to the site. °Activity ° °· For 24 hours after the procedure, or as directed by your health care provider: °? Do not flex or bend the affected arm. °? Do  not push or pull heavy objects with the affected arm. °? Do not drive yourself home from the hospital or clinic. You may drive 24 hours after the procedure unless your health care provider tells you not to. °? Do not operate machinery or power tools. °· Do not lift anything that is heavier than 10 lb (4.5 kg), or the limit that you are told, until your health care provider says that it is safe. °· Ask your health care provider when it is okay to: °? Return to work or school. °? Resume usual physical activities or sports. °? Resume sexual activity. °General instructions °· If the catheter site starts to bleed, raise your arm and put firm pressure on the site. If the bleeding does not stop, get help right away. This is a medical emergency. °· If you went home on the same day as your procedure, a responsible adult should be with you for the first 24 hours after you arrive home. °· Keep all follow-up visits as told by your health care provider. This is important. °Contact a health care provider if: °· You have a fever. °· You have redness, swelling, or yellow drainage around your insertion site. °Get help right away if: °· You have unusual pain at the radial site. °· The catheter insertion area swells very fast. °· The insertion area is bleeding, and the bleeding does not stop when you hold steady pressure on the area. °· Your arm or   hand becomes pale, cool, tingly, or numb. °These symptoms may represent a serious problem that is an emergency. Do not wait to see if the symptoms will go away. Get medical help right away. Call your local emergency services (911 in the [place] Do not drive yourself to the hospital. °Summary °· After the procedure, it is common to have bruising and tenderness at the site. °· Follow instructions from your health care provider about how to take care of your radial site wound. Check the wound every day for signs of infection. °· Do not lift anything that is heavier than 10 lb (4.5 kg), or the  limit that you are told, until your health care provider says that it is safe. °This information is not intended to replace advice given to you by your health care provider. Make sure you discuss any questions you have with your health care provider. °Document Released: 03/10/2010 Document Revised: 03/13/2017 Document Reviewed: 03/13/2017 °Elsevier Interactive Patient Education © 2019 [employer] ° °

## 2018-04-07 NOTE — Brief Op Note (Signed)
BRIEF CARDIAC CATHETERIZATION NOTE  04/07/2018  8:15 AM  PATIENT:  Brent Carson  48 y.o. male  PRE-OPERATIVE DIAGNOSIS:  Stable angina and abnormal cardiac CTA  POST-OPERATIVE DIAGNOSIS:  Same  PROCEDURE:  Procedure(s): LEFT HEART CATH AND CORONARY ANGIOGRAPHY (N/A)  SURGEON:  Surgeon(s) and Role:    * Kimbree Casanas, MD - Primary  FINDINGS: 1. Multivessel CAD, including sequential 80% and 70% proximal and mid LAD stenoses, mild-moderate diffuse LCx disease, 60-70% ostial OM2 stensosis, and CTO of mid RCA with left-to-right collaterals. 2. Basal inferior hypokinesis with otherwise preserved LVEF. 3. Normal LVEDP.  RECOMMENDATIONS: 1. Heart team discussion regarding optimal revascularization strategy (CABG vs PCI of LAD with atherectomy). 2. Start isosorbide mononitrate 30 mg daily. 3. Aggressive secondary prevention.  Yvonne Kendall, MD [facility] Pager: [phone]

## 2018-04-07 NOTE — Telephone Encounter (Signed)
Patient wife calling States that patient had a cath today and is dealing with anxiety  Would like to know if Dr End could prescribe something to help Please call to discuss

## 2018-04-08 ENCOUNTER — Encounter (HOSPITAL_COMMUNITY): Payer: Self-pay | Admitting: Internal Medicine

## 2018-04-08 NOTE — Telephone Encounter (Signed)
Called Cath lab and patient scheduled for 04/11/18 at 12 noon.  Clydie Braun aware that Dr End needs CSI rep as well.  Called patinet. He is agreeable to procedure this Friday. He is aware to arrive at 0930 am. Dr End will plan to have CBC and BMET that morning upon arrival to the preop area.  He verbalized understanding of the follow as well: No solid food after midnight prior to cath, clear liquids until 5 AM day of procedure.  AM meds can be  taken pre-cath with sip of water including: ASA 81 mg  He is aware he will most likely stay the night in the hospital.

## 2018-04-11 ENCOUNTER — Encounter (HOSPITAL_COMMUNITY): Payer: Self-pay | Admitting: *Deleted

## 2018-04-11 ENCOUNTER — Ambulatory Visit (HOSPITAL_COMMUNITY)
Admission: RE | Admit: 2018-04-11 | Discharge: 2018-04-12 | Disposition: A | Discharge status: Home / Self Care | Payer: No Typology Code available for payment source | Attending: Internal Medicine | Admitting: Internal Medicine

## 2018-04-11 ENCOUNTER — Encounter (HOSPITAL_COMMUNITY)
Admission: RE | Disposition: A | Discharge status: Home / Self Care | Payer: Self-pay | Source: Home / Self Care | Attending: Internal Medicine

## 2018-04-11 ENCOUNTER — Other Ambulatory Visit: Payer: Self-pay

## 2018-04-11 DIAGNOSIS — Z79899 Other long term (current) drug therapy: Secondary | ICD-10-CM | POA: Insufficient documentation

## 2018-04-11 DIAGNOSIS — I10 Essential (primary) hypertension: Secondary | ICD-10-CM | POA: Insufficient documentation

## 2018-04-11 DIAGNOSIS — I208 Other forms of angina pectoris: Secondary | ICD-10-CM | POA: Diagnosis present

## 2018-04-11 DIAGNOSIS — I25118 Atherosclerotic heart disease of native coronary artery with other forms of angina pectoris: Secondary | ICD-10-CM

## 2018-04-11 DIAGNOSIS — Z8249 Family history of ischemic heart disease and other diseases of the circulatory system: Secondary | ICD-10-CM | POA: Diagnosis not present

## 2018-04-11 DIAGNOSIS — E785 Hyperlipidemia, unspecified: Secondary | ICD-10-CM | POA: Diagnosis not present

## 2018-04-11 DIAGNOSIS — I2584 Coronary atherosclerosis due to calcified coronary lesion: Secondary | ICD-10-CM | POA: Diagnosis not present

## 2018-04-11 DIAGNOSIS — I2582 Chronic total occlusion of coronary artery: Secondary | ICD-10-CM | POA: Diagnosis not present

## 2018-04-11 DIAGNOSIS — Z87891 Personal history of nicotine dependence: Secondary | ICD-10-CM | POA: Insufficient documentation

## 2018-04-11 DIAGNOSIS — K219 Gastro-esophageal reflux disease without esophagitis: Secondary | ICD-10-CM | POA: Diagnosis not present

## 2018-04-11 HISTORY — PX: CORONARY STENT INTERVENTION: CATH118234

## 2018-04-11 HISTORY — PX: CORONARY ATHERECTOMY: CATH118238

## 2018-04-11 HISTORY — PX: INTRAVASCULAR ULTRASOUND/IVUS: CATH118244

## 2018-04-11 LAB — CBC
HCT: 41.3 % (ref 39.0–52.0)
HCT: 39.5 % (ref 39.0–52.0)
Hemoglobin: 13.9 g/dL (ref 13.0–17.0)
Hemoglobin: 13.2 g/dL (ref 13.0–17.0)
MCH: 29.4 pg (ref 26.0–34.0)
MCH: 29.1 pg (ref 26.0–34.0)
MCHC: 33.7 g/dL (ref 30.0–36.0)
MCHC: 33.4 g/dL (ref 30.0–36.0)
MCV: 87.5 fL (ref 80.0–100.0)
MCV: 87.2 fL (ref 80.0–100.0)
Platelets: 259 10*3/uL (ref 150–400)
Platelets: 262 10*3/uL (ref 150–400)
RBC: 4.72 MIL/uL (ref 4.22–5.81)
RBC: 4.53 MIL/uL (ref 4.22–5.81)
RDW: 11.9 % (ref 11.5–15.5)
RDW: 12 % (ref 11.5–15.5)
WBC: 6.5 10*3/uL (ref 4.0–10.5)
WBC: 7.6 10*3/uL (ref 4.0–10.5)
nRBC: 0 % (ref 0.0–0.2)
nRBC: 0 % (ref 0.0–0.2)

## 2018-04-11 LAB — BASIC METABOLIC PANEL
Anion gap: 10 (ref 5–15)
BUN: 11 mg/dL (ref 6–20)
CO2: 23 mmol/L (ref 22–32)
Calcium: 9.1 mg/dL (ref 8.9–10.3)
Chloride: 103 mmol/L (ref 98–111)
Creatinine, Ser: 0.86 mg/dL (ref 0.61–1.24)
GFR calc Af Amer: >60 mL/min (ref >60)
GFR calc non Af Amer: >60 mL/min (ref >60)
Glucose, Bld: 104 mg/dL — ABNORMAL HIGH (ref 70–99)
Potassium: 4.4 mmol/L (ref 3.5–5.1)
SODIUM: 136 mmol/L (ref 135–145)

## 2018-04-11 LAB — POCT ACTIVATED CLOTTING TIME
Activated Clotting Time: 235 seconds
Activated Clotting Time: 268 seconds
Activated Clotting Time: 279 seconds
Activated Clotting Time: 268 seconds

## 2018-04-11 SURGERY — CORONARY ATHERECTOMY
Anesthesia: LOCAL

## 2018-04-11 MED ORDER — IOHEXOL 350 MG/ML SOLN
INTRAVENOUS | Status: DC | PRN
  Administered 2018-04-11: 140 mL via INTRA_ARTERIAL

## 2018-04-11 MED ORDER — NITROGLYCERIN 0.4 MG SL SUBL: 0.4000 mg | SUBLINGUAL_TABLET | SUBLINGUAL | Status: DC | PRN

## 2018-04-11 MED ORDER — VERAPAMIL HCL 2.5 MG/ML IV SOLN
INTRAVENOUS | Status: DC | PRN
  Administered 2018-04-11: 10 mL via INTRA_ARTERIAL

## 2018-04-11 MED ORDER — CLOPIDOGREL BISULFATE 75 MG PO TABS
75.0000 mg | ORAL_TABLET | Freq: Every day | ORAL | Status: DC
  Administered 2018-04-12: 10:00:00 75 mg via ORAL
  Filled 2018-04-11: qty 1

## 2018-04-11 MED ORDER — SODIUM CHLORIDE 0.9 % IV SOLN: 250.0000 mL | INTRAVENOUS | Status: DC | PRN

## 2018-04-11 MED ORDER — VERAPAMIL HCL 2.5 MG/ML IV SOLN
INTRAVENOUS | Status: AC
  Filled 2018-04-11: qty 2

## 2018-04-11 MED ORDER — SODIUM CHLORIDE 0.9% FLUSH
3.0000 mL | Freq: Two times a day (BID) | INTRAVENOUS | Status: DC
  Administered 2018-04-11: 17:00:00 3 mL via INTRAVENOUS

## 2018-04-11 MED ORDER — FENTANYL CITRATE (PF) 100 MCG/2ML IJ SOLN
INTRAMUSCULAR | Status: DC | PRN
  Administered 2018-04-11 (×4): 25 ug via INTRAVENOUS

## 2018-04-11 MED ORDER — SODIUM CHLORIDE 0.9 % WEIGHT BASED INFUSION
3.0000 mL/kg/h | INTRAVENOUS | Status: DC
  Administered 2018-04-11: 3 mL/kg/h via INTRAVENOUS

## 2018-04-11 MED ORDER — HEPARIN SODIUM (PORCINE) 1000 UNIT/ML IJ SOLN
INTRAMUSCULAR | Status: AC
  Filled 2018-04-11: qty 1

## 2018-04-11 MED ORDER — HYDRALAZINE HCL 20 MG/ML IJ SOLN: 5.0000 mg | INTRAMUSCULAR | Status: AC | PRN

## 2018-04-11 MED ORDER — SODIUM CHLORIDE 0.9% FLUSH: 3.0000 mL | Freq: Two times a day (BID) | INTRAVENOUS | Status: DC

## 2018-04-11 MED ORDER — MIDAZOLAM HCL 2 MG/2ML IJ SOLN
INTRAMUSCULAR | Status: DC | PRN
  Administered 2018-04-11: 1 mg via INTRAVENOUS
  Administered 2018-04-11: 2 mg via INTRAVENOUS
  Administered 2018-04-11: 1 mg via INTRAVENOUS

## 2018-04-11 MED ORDER — MIDAZOLAM HCL 2 MG/2ML IJ SOLN
INTRAMUSCULAR | Status: AC
  Filled 2018-04-11: qty 2

## 2018-04-11 MED ORDER — ANGIOPLASTY BOOK
Freq: Once | Status: AC
  Administered 2018-04-11: 1
  Filled 2018-04-11: qty 1

## 2018-04-11 MED ORDER — FENTANYL CITRATE (PF) 100 MCG/2ML IJ SOLN
INTRAMUSCULAR | Status: AC
  Filled 2018-04-11: qty 2

## 2018-04-11 MED ORDER — HEPARIN (PORCINE) IN NACL 1000-0.9 UT/500ML-% IV SOLN
INTRAVENOUS | Status: DC | PRN
  Administered 2018-04-11 (×2): 500 mL

## 2018-04-11 MED ORDER — METOPROLOL SUCCINATE ER 25 MG PO TB24
25.0000 mg | ORAL_TABLET | Freq: Every day | ORAL | Status: DC
  Administered 2018-04-12: 25 mg via ORAL
  Filled 2018-04-11: qty 1

## 2018-04-11 MED ORDER — ATORVASTATIN CALCIUM 40 MG PO TABS
40.0000 mg | ORAL_TABLET | Freq: Every day | ORAL | Status: DC
  Administered 2018-04-12: 40 mg via ORAL
  Filled 2018-04-11: qty 1

## 2018-04-11 MED ORDER — ONDANSETRON HCL 4 MG/2ML IJ SOLN: 4.0000 mg | Freq: Four times a day (QID) | INTRAMUSCULAR | Status: DC | PRN

## 2018-04-11 MED ORDER — NITROGLYCERIN 1 MG/10 ML FOR IR/CATH LAB
INTRA_ARTERIAL | Status: DC | PRN
  Administered 2018-04-11: 100 ug via INTRACORONARY
  Administered 2018-04-11: 100 ug
  Administered 2018-04-11: 200 ug via INTRACORONARY

## 2018-04-11 MED ORDER — SODIUM CHLORIDE 0.9 % WEIGHT BASED INFUSION: 1.0000 mL/kg/h | INTRAVENOUS | Status: DC

## 2018-04-11 MED ORDER — ISOSORBIDE MONONITRATE ER 30 MG PO TB24
30.0000 mg | ORAL_TABLET | Freq: Every day | ORAL | Status: DC
  Administered 2018-04-12: 30 mg via ORAL
  Filled 2018-04-11: qty 1

## 2018-04-11 MED ORDER — HEPARIN SODIUM (PORCINE) 1000 UNIT/ML IJ SOLN
INTRAMUSCULAR | Status: DC | PRN
  Administered 2018-04-11: 7000 [IU] via INTRAVENOUS
  Administered 2018-04-11: 2000 [IU] via INTRAVENOUS
  Administered 2018-04-11: 3000 [IU] via INTRAVENOUS
  Administered 2018-04-11 (×2): 2000 [IU] via INTRAVENOUS

## 2018-04-11 MED ORDER — LIDOCAINE HCL (PF) 1 % IJ SOLN
INTRAMUSCULAR | Status: AC
  Filled 2018-04-11: qty 30

## 2018-04-11 MED ORDER — PANTOPRAZOLE SODIUM 40 MG PO TBEC
40.0000 mg | DELAYED_RELEASE_TABLET | Freq: Every day | ORAL | Status: DC
  Administered 2018-04-12: 10:00:00 40 mg via ORAL
  Filled 2018-04-11: qty 1

## 2018-04-11 MED ORDER — CLOPIDOGREL BISULFATE 75 MG PO TABS
600.0000 mg | ORAL_TABLET | Freq: Once | ORAL | Status: AC
  Administered 2018-04-11: 600 mg via ORAL
  Filled 2018-04-11: qty 8

## 2018-04-11 MED ORDER — ASPIRIN 81 MG PO CHEW: 81.0000 mg | CHEWABLE_TABLET | ORAL | Status: DC

## 2018-04-11 MED ORDER — HEPARIN (PORCINE) IN NACL 1000-0.9 UT/500ML-% IV SOLN
INTRAVENOUS | Status: AC
  Filled 2018-04-11: qty 1000

## 2018-04-11 MED ORDER — ENOXAPARIN SODIUM 40 MG/0.4ML ~~LOC~~ SOLN
40.0000 mg | SUBCUTANEOUS | Status: DC
  Administered 2018-04-12: 10:00:00 40 mg via SUBCUTANEOUS
  Filled 2018-04-11: qty 0.4

## 2018-04-11 MED ORDER — NITROGLYCERIN 1 MG/10 ML FOR IR/CATH LAB
INTRA_ARTERIAL | Status: AC
  Filled 2018-04-11: qty 10

## 2018-04-11 MED ORDER — ASPIRIN EC 81 MG PO TBEC
81.0000 mg | DELAYED_RELEASE_TABLET | Freq: Every day | ORAL | Status: DC
  Administered 2018-04-12: 81 mg via ORAL
  Filled 2018-04-11: qty 1

## 2018-04-11 MED ORDER — SODIUM CHLORIDE 0.9 % IV SOLN
INTRAVENOUS | Status: AC
  Administered 2018-04-11: 18:00:00 via INTRAVENOUS

## 2018-04-11 MED ORDER — LABETALOL HCL 5 MG/ML IV SOLN: 10.0000 mg | INTRAVENOUS | Status: AC | PRN

## 2018-04-11 MED ORDER — LIDOCAINE HCL (PF) 1 % IJ SOLN
INTRAMUSCULAR | Status: DC | PRN
  Administered 2018-04-11: 2 mL

## 2018-04-11 MED ORDER — ACETAMINOPHEN 325 MG PO TABS: 650.0000 mg | ORAL_TABLET | ORAL | Status: DC | PRN

## 2018-04-11 MED ORDER — SODIUM CHLORIDE 0.9% FLUSH: 3.0000 mL | INTRAVENOUS | Status: DC | PRN

## 2018-04-11 SURGICAL SUPPLY — 27 items
BALLN MINITREK OTW 1.5X12 (BALLOONS) ×2
BALLN SAPPHIRE 3.0X12 (BALLOONS) ×2
BALLN SAPPHIRE ~~LOC~~ 3.5X12 (BALLOONS) ×2 IMPLANT
BALLN SAPPHIRE ~~LOC~~ 3.75X8 (BALLOONS) ×2 IMPLANT
BALLOON MINITREK OTW 1.5X12 (BALLOONS) ×1 IMPLANT
BALLOON SAPPHIRE 3.0X12 (BALLOONS) ×1 IMPLANT
CATH LAUNCHER 6FR EBU 3.75 (CATHETERS) ×2 IMPLANT
CATH OPTICROSS 40MHZ (CATHETERS) IMPLANT
CATH VISTA GUIDE 6FR XBLAD3.5 (CATHETERS) ×2 IMPLANT
CROWN DIAMONDBACK CLASSIC 1.25 (BURR) ×2 IMPLANT
DEVICE RAD COMP TR BAND LRG (VASCULAR PRODUCTS) ×2 IMPLANT
ELECT DEFIB PAD ADLT CADENCE (PAD) ×2 IMPLANT
GLIDESHEATH SLEND SS 6F .021 (SHEATH) ×2 IMPLANT
GUIDEWIRE INQWIRE 1.5J.035X260 (WIRE) ×1 IMPLANT
INQWIRE 1.5J .035X260CM (WIRE) ×2
KIT ENCORE 26 ADVANTAGE (KITS) ×2 IMPLANT
KIT HEART LEFT (KITS) ×2 IMPLANT
KIT HEMO VALVE WATCHDOG (MISCELLANEOUS) ×2 IMPLANT
LUBRICANT VIPERSLIDE CORONARY (MISCELLANEOUS) ×2 IMPLANT
PACK CARDIAC CATHETERIZATION (CUSTOM PROCEDURE TRAY) ×2 IMPLANT
SLED PULL BACK IVUS (MISCELLANEOUS) ×2 IMPLANT
STENT RESOLUTE ONYX 3.0X18 (Permanent Stent) ×2 IMPLANT
STENT RESOLUTE ONYX 3.5X12 (Permanent Stent) ×2 IMPLANT
TRANSDUCER W/STOPCOCK (MISCELLANEOUS) ×2 IMPLANT
TUBING CIL FLEX 10 FLL-RA (TUBING) ×2 IMPLANT
WIRE RUNTHROUGH .014X300CM (WIRE) ×2 IMPLANT
WIRE VIPERWIRE COR FLEX .012 (WIRE) ×4 IMPLANT

## 2018-04-11 NOTE — Progress Notes (Signed)
TR BAND REMOVAL  LOCATION:    left radial  DEFLATED PER PROTOCOL:    Yes.    TIME BAND OFF / DRESSING APPLIED:    1745   SITE UPON ARRIVAL:    Level 0  SITE AFTER BAND REMOVAL:    Level 0  CIRCULATION SENSATION AND MOVEMENT:    Within Normal Limits   Yes.    COMMENTS:   Tolerated procedure well 

## 2018-04-11 NOTE — Interval H&P Note (Signed)
History and Physical Interval Note:  04/11/2018 12:59 PM  Brent Carson  has presented today for cardiac catheteterization, with the diagnosis of stable angina. The various methods of treatment have been discussed with the patient and family. After consideration of risks, benefits and other options for treatment, the patient has consented to  Procedure(s): CORONARY ATHERECTOMY (N/A) as a surgical intervention .  The patient's history has been reviewed, patient examined, no change in status, stable for surgery.  I have reviewed the patient's chart and labs.  Questions were answered to the patient's satisfaction.  Following cardiac catheterization earlier this week, coronary angiograms were reviewed with interventional cardiology and cardiac surgery.  It was felt that PCI/atherectomy to the LAD with medical therapy of CTO of the RCA and CABG (LIMA-LAD and SVG-rPDA) were both reasonable options.  I discussed this at length with [name] by phone, including the risks and benefits of each procedure.  He would like to avoid open heart surgery if at all possible and has elected to proceed with PCI to the LAD.  Cath Lab Visit (complete for each Cath Lab visit)  Clinical Evaluation Leading to the Procedure:   ACS: No.  Non-ACS:    Anginal Classification: CCS III  Anti-ischemic medical therapy: Maximal Therapy (2 or more classes of medications)  Non-Invasive Test Results: High-risk stress test findings: cardiac mortality >3%/year  Prior CABG: No previous CABG  Brent Carson

## 2018-04-11 NOTE — Brief Op Note (Signed)
BRIEF CARDIAC CATHETERIZATION NOTE  DATE: 04/11/2018 TIME: 3:46 PM  PATIENT:  Brent Carson  49 y.o. male  PRE-OPERATIVE DIAGNOSIS:  Stable angina, multivessel CAD  POST-OPERATIVE DIAGNOSIS:  Same  PROCEDURE:  Procedure(s) with comments: CORONARY ATHERECTOMY (N/A) Intravascular Ultrasound/IVUS (N/A) - LAD CORONARY STENT INTERVENTION (N/A) - LAD   SURGEON:  Surgeon(s) and Role:    * Ambre Kobayashi, Cristal Deer, MD - Primary  FINDINGS: 1. Severe 2-vessel CAD with sequential 80-90% proximal and 70% mid LAD stenoses, as well as CTO of RCA. 2. Normal LVEDP. 3. Successful orbital atherectomy and IVUS-guided PCI to LAD using Resolute Onyx 3.5 x 12 mm (proximal) and 3.0 x 18 mm (mid) with 0% residual stenosis and TIMI-3 flow.  RECOMMENDATIONS: 1. Overnight observation. 2. Indefinite DAPT with ASA and clopidogrel. 3. Aggressive secondary prevention and medical management of CTO of RCA.  Yvonne Kendall, MD [facility] Pager: [phone]

## 2018-04-11 NOTE — Telephone Encounter (Signed)
Appreciate the update. Mark Skains, MD  

## 2018-04-12 ENCOUNTER — Emergency Department (HOSPITAL_COMMUNITY): Payer: No Typology Code available for payment source

## 2018-04-12 ENCOUNTER — Encounter (HOSPITAL_COMMUNITY): Payer: Self-pay | Admitting: *Deleted

## 2018-04-12 ENCOUNTER — Emergency Department (HOSPITAL_BASED_OUTPATIENT_CLINIC_OR_DEPARTMENT_OTHER)
Admission: EM | Admit: 2018-04-12 | Discharge: 2018-04-12 | Disposition: A | Discharge status: Home / Self Care | Payer: No Typology Code available for payment source | Source: Home / Self Care | Attending: Emergency Medicine | Admitting: Emergency Medicine

## 2018-04-12 ENCOUNTER — Other Ambulatory Visit: Payer: Self-pay

## 2018-04-12 DIAGNOSIS — Z79899 Other long term (current) drug therapy: Secondary | ICD-10-CM

## 2018-04-12 DIAGNOSIS — I208 Other forms of angina pectoris: Secondary | ICD-10-CM

## 2018-04-12 DIAGNOSIS — I2510 Atherosclerotic heart disease of native coronary artery without angina pectoris: Secondary | ICD-10-CM

## 2018-04-12 DIAGNOSIS — Z87891 Personal history of nicotine dependence: Secondary | ICD-10-CM

## 2018-04-12 DIAGNOSIS — R51 Headache: Secondary | ICD-10-CM

## 2018-04-12 DIAGNOSIS — Z7982 Long term (current) use of aspirin: Secondary | ICD-10-CM

## 2018-04-12 DIAGNOSIS — R55 Syncope and collapse: Secondary | ICD-10-CM

## 2018-04-12 DIAGNOSIS — Z9889 Other specified postprocedural states: Secondary | ICD-10-CM | POA: Insufficient documentation

## 2018-04-12 DIAGNOSIS — R110 Nausea: Secondary | ICD-10-CM

## 2018-04-12 DIAGNOSIS — I10 Essential (primary) hypertension: Secondary | ICD-10-CM | POA: Insufficient documentation

## 2018-04-12 LAB — CBC
HCT: 37.7 % — ABNORMAL LOW (ref 39.0–52.0)
HCT: 39.3 % (ref 39.0–52.0)
Hemoglobin: 12.6 g/dL — ABNORMAL LOW (ref 13.0–17.0)
Hemoglobin: 13.2 g/dL (ref 13.0–17.0)
MCH: 28.8 pg (ref 26.0–34.0)
MCH: 29.2 pg (ref 26.0–34.0)
MCHC: 33.4 g/dL (ref 30.0–36.0)
MCHC: 33.6 g/dL (ref 30.0–36.0)
MCV: 86.3 fL (ref 80.0–100.0)
MCV: 86.9 fL (ref 80.0–100.0)
Platelets: 245 10*3/uL (ref 150–400)
Platelets: 256 10*3/uL (ref 150–400)
RBC: 4.37 MIL/uL (ref 4.22–5.81)
RBC: 4.52 MIL/uL (ref 4.22–5.81)
RDW: 11.9 % (ref 11.5–15.5)
RDW: 12 % (ref 11.5–15.5)
WBC: 8.7 10*3/uL (ref 4.0–10.5)
WBC: 10.6 10*3/uL — ABNORMAL HIGH (ref 4.0–10.5)
nRBC: 0 % (ref 0.0–0.2)
nRBC: 0 % (ref 0.0–0.2)

## 2018-04-12 LAB — BASIC METABOLIC PANEL
Anion gap: 11 (ref 5–15)
BUN: 10 mg/dL (ref 6–20)
CO2: 23 mmol/L (ref 22–32)
Calcium: 8.8 mg/dL — ABNORMAL LOW (ref 8.9–10.3)
Chloride: 101 mmol/L (ref 98–111)
Creatinine, Ser: 0.85 mg/dL (ref 0.61–1.24)
GFR calc Af Amer: >60 mL/min (ref >60)
GLUCOSE: 108 mg/dL — AB (ref 70–99)
Potassium: 3.7 mmol/L (ref 3.5–5.1)
Sodium: 135 mmol/L (ref 135–145)

## 2018-04-12 LAB — COMPREHENSIVE METABOLIC PANEL
ALK PHOS: 75 U/L (ref 38–126)
ALT: 36 U/L (ref 0–44)
AST: 29 U/L (ref 15–41)
Albumin: 3.9 g/dL (ref 3.5–5.0)
Anion gap: 10 (ref 5–15)
BUN: 8 mg/dL (ref 6–20)
CALCIUM: 8.9 mg/dL (ref 8.9–10.3)
CO2: 24 mmol/L (ref 22–32)
Chloride: 100 mmol/L (ref 98–111)
Creatinine, Ser: 0.86 mg/dL (ref 0.61–1.24)
GFR calc Af Amer: >60 mL/min (ref >60)
GFR calc non Af Amer: >60 mL/min (ref >60)
Glucose, Bld: 116 mg/dL — ABNORMAL HIGH (ref 70–99)
Potassium: 3.9 mmol/L (ref 3.5–5.1)
SODIUM: 134 mmol/L — AB (ref 135–145)
Total Bilirubin: 0.9 mg/dL (ref 0.3–1.2)
Total Protein: 6.4 g/dL — ABNORMAL LOW (ref 6.5–8.1)

## 2018-04-12 MED ORDER — CLOPIDOGREL BISULFATE 75 MG PO TABS: 75.0000 mg | ORAL_TABLET | Freq: Every day | ORAL | 2 refills | Status: DC

## 2018-04-12 MED ORDER — ACETAMINOPHEN 500 MG PO TABS
1000.0000 mg | ORAL_TABLET | Freq: Once | ORAL | Status: AC
  Administered 2018-04-12: 1000 mg via ORAL
  Filled 2018-04-12: qty 2

## 2018-04-12 MED ORDER — SODIUM CHLORIDE 0.9 % IV BOLUS
1000.0000 mL | Freq: Once | INTRAVENOUS | Status: AC
  Administered 2018-04-12: 1000 mL via INTRAVENOUS

## 2018-04-12 MED ORDER — CLOPIDOGREL BISULFATE 75 MG PO TABS
75.0000 mg | ORAL_TABLET | Freq: Once | ORAL | Status: AC
  Administered 2018-04-12: 75 mg via ORAL
  Filled 2018-04-12: qty 1

## 2018-04-12 NOTE — ED Provider Notes (Signed)
[facility]   CSN: [id number] Arrival date & time: 04/12/18  1212    History   Chief Complaint Chief Complaint  Patient presents with  . Near Syncope    HPI Rolan BuccoDennis Haden is a 48 y.o. male.     Patient with hx cad, s/p stent x 2 yesterday, c/o feeling faint after d/c from hospital today. States was in vehicle, on way home, when began to feel faint, lightheaded, as if may pass out. No loc. +mild nausea. Denies associated cp or discomfort. No palpitations or sense of rapid or slow or irregular heartbeat. No hx same. States symptoms lasted approximately 5 minutes, then slowly improved. EMS notes blood sugar normal. Pt notes feeling a bit improved now, but states dull frontal headache, moderate. No acute or thunderclap headache when having onset of symptoms. Denies new/meds change in meds other than imdur which was prescribed 4 days ago, and plavix post stent. Denies any blood loss, rectal bleeding, no melena. No fever or chills. No abd pain.   The history is provided by the patient, the spouse and the EMS personnel.  Near Syncope  Pertinent negatives include no chest pain, no abdominal pain, no headaches and no shortness of breath.    Past Medical History:  Diagnosis Date  . Chronic GERD   . Gastroesophageal reflux disease without esophagitis   . Hiatal hernia   . Hyperlipidemia   . Hypertension   . Idiopathic gout, right ankle and foot   . Low testosterone   . Reflux   . Tobacco dependence   . Vitamin D deficiency     Patient Active Problem List   Diagnosis Date Noted  . Stable angina (HCC) 04/07/2018  . Abnormal cardiac CT angiography     Past Surgical History:  Procedure Laterality Date  . LEFT HEART CATH AND CORONARY ANGIOGRAPHY N/A 04/07/2018   Procedure: LEFT HEART CATH AND CORONARY ANGIOGRAPHY;  Surgeon: Yvonne KendallEnd, Christopher, MD;  Location: [facility] INVASIVE CV LAB;  Service: Cardiovascular;  Laterality: N/A;         Home Medications    Prior to Admission medications   Medication Sig Start Date End Date Taking? Authorizing Provider  aspirin EC 81 MG tablet Take 1 tablet (81 mg total) by mouth daily. 04/02/18   Jake BatheSkains, Mark C, MD  atorvastatin (LIPITOR) 40 MG tablet Take 1 tablet (40 mg total) by mouth daily. 03/31/18   Jake BatheSkains, Mark C, MD  cholecalciferol (VITAMIN D3) 25 MCG (1000 UT) tablet Take 1,000 Units by mouth daily.    [provider]  clopidogrel (PLAVIX) 75 MG tablet Take 1 tablet (75 mg total) by mouth daily with breakfast. 04/12/18   Laverda Pageoberts, Lindsay B, NP  isosorbide mononitrate (IMDUR) 30 MG 24 hr tablet Take 1 tablet (30 mg total) by mouth daily. 04/07/18 04/07/19  Arty Baumgartneroberts, Lindsay B, NP  metoprolol succinate (TOPROL-XL) 25 MG 24 hr tablet Take 1 tablet (25 mg total) by mouth daily. 03/31/18   Jake BatheSkains, Mark C, MD  nitroGLYCERIN (NITROSTAT) 0.4 MG SL tablet Place 1 tablet (0.4 mg total) under the tongue every 5 (five) minutes as needed for chest pain. 04/07/18 04/07/19  Arty Baumgartneroberts, Lindsay B, NP  pantoprazole (PROTONIX) 40 MG tablet Take 40 mg by mouth daily.    [provider]    Family History Family History  Problem Relation Age of Onset  . CVA Father   . Hypertension Father   . Gout Father   . Colon cancer Neg  Hx   . Colon polyps Neg Hx   . Liver disease Neg Hx     Social History Social History   Tobacco Use  . Smoking status: Former Games developer  . Smokeless tobacco: Never Used  Substance Use Topics  . Alcohol use: Yes    Comment: 2 per week  . Drug use: No     Allergies   Simcor [niacin-simvastatin er]   Review of Systems Review of Systems  Constitutional: Negative for fever.  HENT: Negative for sore throat.   Eyes: Negative for redness.  Respiratory: Negative for cough and shortness of breath.   Cardiovascular: Positive for near-syncope. Negative for chest pain, palpitations and leg swelling.  Gastrointestinal: Negative for abdominal pain, blood in  stool and vomiting.  Endocrine: Negative for polyuria.  Genitourinary: Negative for dysuria and flank pain.  Musculoskeletal: Negative for back pain and neck pain.  Skin: Negative for rash.  Neurological: Positive for light-headedness. Negative for speech difficulty, weakness, numbness and headaches.  Hematological: Does not bruise/bleed easily.  Psychiatric/Behavioral: Negative for confusion.     Physical Exam Updated Vital Signs BP 110/76 (BP Location: Right Arm)   Pulse 65   Temp 98.3 F (36.8 C) (Oral)   Resp 16   Ht 1.702 m (5\' 7" )   Wt 78.9 kg   SpO2 97%   BMI 27.24 kg/m   Physical Exam Vitals signs and nursing note reviewed.  Constitutional:      Appearance: Normal appearance. He is well-developed.  HENT:     Head: Atraumatic.     Comments: No sinus or temporal tenderness.     Nose: Nose normal.     Mouth/Throat:     Mouth: Mucous membranes are moist.     Pharynx: Oropharynx is clear.  Eyes:     General: No scleral icterus.    Conjunctiva/sclera: Conjunctivae normal.     Pupils: Pupils are equal, round, and reactive to light.  Neck:     Musculoskeletal: Normal range of motion and neck supple. No neck rigidity.     Trachea: No tracheal deviation.  Cardiovascular:     Rate and Rhythm: Normal rate and regular rhythm.     Pulses: Normal pulses.     Heart sounds: Normal heart sounds. No murmur. No friction rub. No gallop.      Comments: No hematoma at cath site.  Pulmonary:     Effort: Pulmonary effort is normal. No accessory muscle usage or respiratory distress.     Breath sounds: Normal breath sounds.  Abdominal:     General: Bowel sounds are normal. There is no distension.     Palpations: Abdomen is soft. There is no mass.     Tenderness: There is no abdominal tenderness. There is no guarding.     Comments: No puls mass.   Genitourinary:    Comments: No cva tenderness. Musculoskeletal:        General: No swelling or tenderness.     Right lower leg: No  edema.     Left lower leg: No edema.  Skin:    General: Skin is warm and dry.     Findings: No rash.  Neurological:     Mental Status: He is alert.     Comments: Alert, speech clear. Motor/sens grossly intact bil.   Psychiatric:        Mood and Affect: Mood normal.      ED Treatments / Results  Labs (all labs ordered are listed, but only abnormal results are  displayed) Results for orders placed or performed during the hospital encounter of 04/12/18  Comprehensive metabolic panel  Result Value Ref Range   Sodium 134 (L) 135 - 145 mmol/L   Potassium 3.9 3.5 - 5.1 mmol/L   Chloride 100 98 - 111 mmol/L   CO2 24 22 - 32 mmol/L   Glucose, Bld 116 (H) 70 - 99 mg/dL   BUN 8 6 - 20 mg/dL   Creatinine, Ser 1.82 0.61 - 1.24 mg/dL   Calcium 8.9 8.9 - 99.3 mg/dL   Total Protein 6.4 (L) 6.5 - 8.1 g/dL   Albumin 3.9 3.5 - 5.0 g/dL   AST 29 15 - 41 U/L   ALT 36 0 - 44 U/L   Alkaline Phosphatase 75 38 - 126 U/L   Total Bilirubin 0.9 0.3 - 1.2 mg/dL   GFR calc non Af Amer >60 >60 mL/min   GFR calc Af Amer >60 >60 mL/min   Anion gap 10 5 - 15  CBC  Result Value Ref Range   WBC 10.6 (H) 4.0 - 10.5 K/uL   RBC 4.52 4.22 - 5.81 MIL/uL   Hemoglobin 13.2 13.0 - 17.0 g/dL   HCT 71.6 96.7 - 89.3 %   MCV 86.9 80.0 - 100.0 fL   MCH 29.2 26.0 - 34.0 pg   MCHC 33.6 30.0 - 36.0 g/dL   RDW 81.0 17.5 - 10.2 %   Platelets 256 150 - 400 K/uL   nRBC 0.0 0.0 - 0.2 %   Ct Head Wo Contrast  Result Date: 04/12/2018 CLINICAL DATA:  Near syncopal episode post cardiac stent placement yesterday. EXAM: CT HEAD WITHOUT CONTRAST TECHNIQUE: Contiguous axial images were obtained from the base of the skull through the vertex without intravenous contrast. COMPARISON:  None. FINDINGS: Brain: No evidence of acute infarction, hemorrhage, hydrocephalus, extra-axial collection or mass lesion/mass effect. Vascular: Mild calcific atherosclerotic disease of the intra cavernous carotid arteries. Skull: Normal. Negative for  fracture or focal lesion. Sinuses/Orbits: Mucous retention cyst within the right maxillary sinus. Other: None. IMPRESSION: No acute intracranial abnormality. Electronically Signed   By: Ted Mcalpine M.D.   On: 04/12/2018 13:49   Ct Coronary Morph W/cta Cor W/score W/ca W/cm &/or Wo/cm  Addendum Date: 03/28/2018   ADDENDUM REPORT: 03/28/2018 10:17 CLINICAL DATA:  Chest pain EXAM: Cardiac CTA MEDICATIONS: Sub lingual nitro. 4mg  x 2 TECHNIQUE: The patient was scanned on a Siemens 192 slice scanner. Gantry rotation speed was 250 msecs. Collimation was 0.6 mm. A 100 kV prospective scan was triggered in the ascending thoracic aorta at 35-75% of the R-R interval. Average HR during the scan was 60 bpm. The 3D data set was interpreted on a dedicated work station using MPR, MIP and VRT modes. A total of 80cc of contrast was used. FINDINGS: Non-cardiac: See separate report from [facility]. Pulmonary veins drain normally to the left atrium. Calcium Score: 3254 Agatston units. Coronary Arteries: Right dominant with no anomalies LM: No plaque or stenosis. LAD system: Extensive mixed plaque in the proximal LAD, there appears to be severe (70-90%) stenosis. Circumflex system: Extensive mixed plaque in the LCx system. Small OM1 with probably 50% proximal stenosis. Moderate OM2 with mild (<50%) stenosis proximally. Degree of stenosis in the AV LCx does not appear to exceed 50%. RCA system: There is extensive mixed plaque in the mid to distal RCA. There appears to be critical (>90%) stenosis in the mid RCA and severe (70-90%) stenosis in the distal RCA. IMPRESSION: 1. Coronary artery calcium  score 3254 Agatston units. This places the patient in the 99th percentile for age and gender, suggesting high risk for future cardiac events. 2.  Suspect severe proximal LAD stenosis (70-90%). 3. Suspect critical mid RCA stenosis (>90%) and severe distal RCA stenosis (70-90%). Will send for FFR but recommend cardiac cath.  Dalton Mclean Electronically Signed   By: Marca Ancona M.D.   On: 03/28/2018 10:17   Result Date: 03/28/2018 EXAM: OVER-READ INTERPRETATION  CT CHEST The following report is an over-read performed by radiologist Dr. Genevive Bi of [facility] on 03/28/2018. This over-read does not include interpretation of cardiac or coronary anatomy or pathology. The coronary CTA interpretation by the cardiologist is attached. COMPARISON:  None. FINDINGS: Limited view of the lung parenchyma demonstrates no suspicious nodularity. Airways are normal. Limited view of the mediastinum demonstrates no adenopathy. Esophagus normal. Limited view of the upper abdomen unremarkable. Limited view of the skeleton and chest wall is unremarkable. IMPRESSION: No significant extracardiac findings Electronically Signed: By: Genevive Bi M.D. On: 03/28/2018 08:02   Ct Coronary Fractional Flow Reserve Fluid Analysis  Result Date: 03/31/2018 CLINICAL DATA:  Chest pain EXAM: CT FFR MEDICATIONS: No additional medications. TECHNIQUE: The coronary CTA was sent for FFR FINDINGS: FFR 0.72 mid LAD FFR 0.65 distal RCA, <0.5 distal RCA. FFR 0.97 mid LCX IMPRESSION: 1.  Hemodynamically significant proximal LAD stenosis. 2.  Hemodynamically significant mid RCA stenosis. Dalton Mclean Electronically Signed   By: Marca Ancona M.D.   On: 03/31/2018 17:18    EKG EKG Interpretation  Date/Time:  Saturday April 12 2018 12:20:28 EST Ventricular Rate:  65 PR Interval:    QRS Duration: 89 QT Interval:  410 QTC Calculation: 427 R Axis:   41 Text Interpretation:  Sinus rhythm Nonspecific T wave abnormality No significant change since last tracing Confirmed by Cathren Laine ([id number]) on 04/12/2018 12:24:45 PM   Radiology Ct Head Wo Contrast  Result Date: 04/12/2018 CLINICAL DATA:  Near syncopal episode post cardiac stent placement yesterday. EXAM: CT HEAD WITHOUT CONTRAST TECHNIQUE: Contiguous axial images were obtained from the  base of the skull through the vertex without intravenous contrast. COMPARISON:  None. FINDINGS: Brain: No evidence of acute infarction, hemorrhage, hydrocephalus, extra-axial collection or mass lesion/mass effect. Vascular: Mild calcific atherosclerotic disease of the intra cavernous carotid arteries. Skull: Normal. Negative for fracture or focal lesion. Sinuses/Orbits: Mucous retention cyst within the right maxillary sinus. Other: None. IMPRESSION: No acute intracranial abnormality. Electronically Signed   By: Ted Mcalpine M.D.   On: 04/12/2018 13:49    Procedures Procedures (including critical care time)  Medications Ordered in ED Medications  acetaminophen (TYLENOL) tablet 1,000 mg (1,000 mg Oral Given 04/12/18 1245)     Initial Impression / Assessment and Plan / ED Course  I have reviewed the triage vital signs and the nursing notes.  Pertinent labs & imaging results that were available during my care of the patient were reviewed by me and considered in my medical decision making (see chart for details).  Iv ns. Continuous pulse ox and monitor. Ecg. Labs.  Pts cardiologist, Dr End was in ED and evaluation pt - he indicates while imdur rx was Tuesday, pt was not given first dose until 10 AM today. He feels symptoms likely related to that, and he indicates he has instructed pt to hold imdur and metoprolol, and indicates pt can f/u with cardiology as outpt.   Iv ns bolus. Po fluids. Food.   Reviewed nursing notes and prior charts  for additional history.   Ambulate in hall.   Tolerating po well. No recurrence of faintness/lightheadedness.   Vitals normal.  Pt/spouse indicate his plavix rx is awaiting at their local pharmacy, but now their pharmacy is closed. They request dose here and for tomorrow.   plavix po.   Pt currently asymptomatic, and appears stable for d/c.   Has plan for close outpt cardiology f/u.  Return precautions provided.     Final Clinical  Impressions(s) / ED Diagnoses   Final diagnoses:  None    ED Discharge Orders    None       Cathren Laine, MD 04/12/18 1459

## 2018-04-12 NOTE — Progress Notes (Signed)
CARDIAC REHAB PHASE I   PRE:  Rate/Rhythm: 70 SR  BP:  Supine: 138/87 Sitting:   Standing:    SaO2: 96% RA  MODE:  Ambulation: 600 ft   POST:  Rate/Rhythm: 98 SR  BP:  Supine: 138/90 Sitting:   Standing:    SaO2: 96% RA  [id number] Patient tolerated ambulation well without symptoms. Diastolic BP slightly elevated. PCI/stent education completed with patient and patient's wife including restrictions, Plavix use, CP, NTG use, and calling 911, risk factor modification, including heart healthy diet hand out, and activity progression, exercise guidelines given. Pt verbalizes understanding of instructions given. Discussed phase 2 cardiac rehab, and pt is interested in the program at [facility], referral sent.  Cristy Hilts

## 2018-04-12 NOTE — Progress Notes (Signed)
Progress Note  Patient Name: Brent Carson Date of Encounter: 04/12/2018  Primary Cardiologist: Donato Schultz, MD   Subjective   Post LAD PCI x2 with atherectomy.  Feeling well this morning.  About to get up and move around with cardiac rehab.  No chest pain fevers chills nausea vomiting syncope bleeding.  Left radial approach.  Inpatient Medications    Scheduled Meds: . aspirin EC  81 mg Oral Daily  . atorvastatin  40 mg Oral Daily  . clopidogrel  75 mg Oral Q breakfast  . enoxaparin (LOVENOX) injection  40 mg Subcutaneous Q24H  . isosorbide mononitrate  30 mg Oral Daily  . metoprolol succinate  25 mg Oral Daily  . pantoprazole  40 mg Oral Daily  . sodium chloride flush  3 mL Intravenous Q12H   Continuous Infusions: . sodium chloride     PRN Meds: sodium chloride, acetaminophen, nitroGLYCERIN, ondansetron (ZOFRAN) IV, sodium chloride flush   Vital Signs    Vitals:   04/11/18 2000 04/11/18 2100 04/12/18 0323 04/12/18 0740  BP: 119/67  127/88 130/79  Pulse: 76 73 64 62  Resp: 13 19 16 13   Temp:   98.2 F (36.8 C) (!) 97.5 F (36.4 C)  TempSrc:   Oral Oral  SpO2: 97% 97% 97% 98%  Weight:   78.9 kg   Height:        Intake/Output Summary (Last 24 hours) at 04/12/2018 0834 Last data filed at 04/12/2018 0742 Gross per 24 hour  Intake 220 ml  Output 950 ml  Net -730 ml   Last 3 Weights 04/12/2018 04/11/2018 04/07/2018  Weight (lbs) 173 lb 15.1 oz 178 lb 178 lb  Weight (kg) 78.9 kg 80.74 kg 80.74 kg      Telemetry    Sinus rhythm no adverse arrhythmias- Personally Reviewed  ECG    Sinus rhythm with nonspecific ST-T wave changes, septal infarct pattern- Personally Reviewed  Physical Exam   GEN: No acute distress.   Neck: No JVD Cardiac: RRR, no murmurs, rubs, or gallops.  Respiratory: Clear to auscultation bilaterally. GI: Soft, nontender, non-distended  MS: No edema; No deformity.  Cardiac catheterization site left radial appears normal. Neuro:  Nonfocal    Psych: Normal affect   Labs    Chemistry Recent Labs  Lab 04/11/18 1003 04/12/18 0238  NA 136 135  K 4.4 3.7  CL 103 101  CO2 23 23  GLUCOSE 104* 108*  BUN 11 10  CREATININE 0.86 0.85  CALCIUM 9.1 8.8*  GFRNONAA >60 >60  GFRAA >60 >60  ANIONGAP 10 11     Hematology Recent Labs  Lab 04/11/18 1003 04/11/18 1701 04/12/18 0238  WBC 6.5 7.6 8.7  RBC 4.72 4.53 4.37  HGB 13.9 13.2 12.6*  HCT 41.3 39.5 37.7*  MCV 87.5 87.2 86.3  MCH 29.4 29.1 28.8  MCHC 33.7 33.4 33.4  RDW 11.9 12.0 11.9  PLT 259 262 245    Cardiac EnzymesNo results for input(s): TROPONINI in the last 168 hours. No results for input(s): TROPIPOC in the last 168 hours.   BNPNo results for input(s): BNP, PROBNP in the last 168 hours.   DDimer No results for input(s): DDIMER in the last 168 hours.   Radiology    No results found.  Cardiac Studies   1. Severe two-vessel CAD with sequential 80-90% proximal and 70% mid LAD stenoses, as well as CTO of RCA. 2. Normal left ventricular filling pressure. 3. Successful orbital atherectomy and IVUS-guided PCI to LAD  using Resolute Onyx 3.5 x 12 mm (proximal) and 3.0 x 18 mm (mid) with 0% residual stenosis and TIMI-3 flow.  Recommendations: 1. Overnight observation. 2. Indefinite dual antiplatelet therapy with aspirin and clopidogrel. 3. Aggressive secondary prevention and medical management of CTO of RCA.  Patient Profile     48 y.o. male with recent accelerating angina symptoms with newly discovered severe coronary artery disease of LAD as well as RCA (CTO of RCA).  Came in for atherectomy of LAD and stent placement of LAD  Assessment & Plan    Coronary artery disease - Doing well post 2 DES to LAD.  See above for details.  Dr. end.  Continue with dual antiplatelet therapy for 1 year.  After that may continue Plavix lifelong. -Continue with medical management for his RCA CTO.  Consider isosorbide in the future.  Consider increasing metoprolol as  fit. -Extensive discussion previously with interventional cardiology, cardiothoracic surgery and patient.  Please see prior history.  Joint decision was made to proceed with atherectomy of LAD.  Dyspnea -Hopefully this will improve status post revascularization of LAD.  Hyperlipidemia - Atorvastatin recently increased to high intensity dose.  Continue to monitor lipids.  LDL goal less than 70.  Essential hypertension - Overall well controlled currently.  ACE inhibitor.  Abnormal EKG -Prior EKG showed more extensive T wave inversions in the precordial leads suggestive of ischemia.  These seem to have improved on latest EKG.  Nonspecific changes.  Former smoker - No longer smoking, no longer vaping.  Continue with cessation.  Cardiac rehabilitation.  Ejection fraction 60 to 65%.  Okay for discharge home.  He has close follow-up with me in March.  Donato Schultz, MD   For questions or updates, please contact [facility] Please consult [email or web address] for contact info under        Signed, Donato Schultz, MD  04/12/2018, 8:34 AM

## 2018-04-12 NOTE — Consult Note (Signed)
Cardiology Consultation:   Patient ID: Brent Carson MRN: [id number]; DOB: [birth year]  Admit date: 04/12/2018 Date of Consult: 04/12/2018  Primary Care Provider: Heide Scales, PA-C Primary Cardiologist: Donato Schultz, MD  Primary Electrophysiologist:  None    Patient Profile:   Brent Carson is a 48 y.o. male with a hx of CAD s/p PCI to proximal and mid LAD yesterday, HTN, HLD, and OSA, who is being seen today for the evaluation of near-syncope at the request of Dr. Denton Lank.  History of Present Illness:   Mr. Brent Carson underwent orbital atherectomy and drug eluting stent placement to the proximal and mid LAD yesterday.  He also has a known CTO of the RCA.  Procedure was uncomplicated.  His post-PCI course was uncomplicated overnight, though he notes that he did not sleep well or eat much yesterday or this morning.  He ambulated with cardiac rehab this morning without any issues.  While driving home after being discharged, he became acutely lightheaded and felt as though he was going to pass out.  He never lost consciousness.  He also developed a headache.  His wife stopped and called 911.  By the time they arrived, symptoms had already improved.  At this time, Mr. Brent Carson feels tired but otherwise is back to baseline.  He has not had any chest pain, shortness of breath, or palpitations.   Of note, he had been prescribed isosorbide mononitrate 30 mg daily after diagnostic catheterization on Monday.  He reports that he did not start taking it, but was given a dose this morning shortly before discharge.  Past Medical History:  Diagnosis Date  . Chronic GERD   . Gastroesophageal reflux disease without esophagitis   . Hiatal hernia   . Hyperlipidemia   . Hypertension   . Idiopathic gout, right ankle and foot   . Low testosterone   . Reflux   . Tobacco dependence   . Vitamin D deficiency     Past Surgical History:  Procedure Laterality Date  . LEFT HEART CATH AND CORONARY ANGIOGRAPHY N/A  04/07/2018   Procedure: LEFT HEART CATH AND CORONARY ANGIOGRAPHY;  Surgeon: Yvonne Kendall, MD;  Location: [facility];  Service: Cardiovascular;  Laterality: N/A;     Home Medications:  Prior to Admission medications   Medication Sig Start Date Joash Tony Date Taking? Authorizing Provider  aspirin EC 81 MG tablet Take 1 tablet (81 mg total) by mouth daily. 04/02/18   Jake Bathe, MD  atorvastatin (LIPITOR) 40 MG tablet Take 1 tablet (40 mg total) by mouth daily. 03/31/18   Jake Bathe, MD  cholecalciferol (VITAMIN D3) 25 MCG (1000 UT) tablet Take 1,000 Units by mouth daily.    [provider]  clopidogrel (PLAVIX) 75 MG tablet Take 1 tablet (75 mg total) by mouth daily with breakfast. 04/12/18   Laverda Page B, NP  isosorbide mononitrate (IMDUR) 30 MG 24 hr tablet Take 1 tablet (30 mg total) by mouth daily. 04/07/18 04/07/19  Arty Baumgartner, NP  metoprolol succinate (TOPROL-XL) 25 MG 24 hr tablet Take 1 tablet (25 mg total) by mouth daily. 03/31/18   Jake Bathe, MD  nitroGLYCERIN (NITROSTAT) 0.4 MG SL tablet Place 1 tablet (0.4 mg total) under the tongue every 5 (five) minutes as needed for chest pain. 04/07/18 04/07/19  Arty Baumgartner, NP  pantoprazole (PROTONIX) 40 MG tablet Take 40 mg by mouth daily.    [provider]    Inpatient Medications: Scheduled Meds:  Continuous  Infusions:  PRN Meds:   Allergies:    Allergies  Allergen Reactions  . Simcor [Niacin-Simvastatin Er]     Flushing and chest pain    Social History:   Social History   Tobacco Use  . Smoking status: Former Games developer  . Smokeless tobacco: Never Used  Substance Use Topics  . Alcohol use: Yes    Comment: 2 per week  . Drug use: No     Family History:   Family History  Problem Relation Age of Onset  . CVA Father   . Hypertension Father   . Gout Father   . Colon cancer Neg Hx   . Colon polyps Neg Hx   . Liver disease Neg Hx      ROS:  Wife noted some bruising along  the left lateral chest wall. All other ROS reviewed and negative.     Physical Exam/Data:   Vitals:   04/12/18 1229 04/12/18 1231  BP: 110/76   Pulse: 65   Resp: 16   Temp: 98.3 F (36.8 C)   TempSrc: Oral   SpO2: 97%   Weight:  78.9 kg  Height:  5\' 7"  (1.702 m)   No intake or output data in the 24 hours ending 04/12/18 1303 Last 3 Weights 04/12/2018 04/12/2018 04/11/2018  Weight (lbs) 173 lb 15.1 oz 173 lb 15.1 oz 178 lb  Weight (kg) 78.9 kg 78.9 kg 80.74 kg     Body mass index is 27.24 kg/m.  General:  Well nourished, well developed, in no acute distress.  Wife is at the bedside. HEENT: normal Lymph: no adenopathy Neck: no JVD Endocrine:  No thryomegaly Vascular: Left radial arteriotomy covered with clean dressing with 2+ pulse.  Bruising fo the left forearm noted but no hematoma. Heart: Regular rate and rhythm without murmurs, rubs, or gallops Lungs:  clear to auscultation bilaterally, no wheezing, rhonchi or rales  Abd: soft, nontender, no hepatomegaly  Ext: no edema Skin: Scattered ecchymoses noted along the left lateral chest wall in the shape of defibrillation pad. Neuro:  A&O x 3.  No focal deficits. Psych:  Normal affect   EKG:  The EKG was personally reviewed and demonstrates:  NSR with non-specific ST/T changes.  No significant change from prior tracing.  Relevant CV Studies: PCI (04/11/2018): 1. Severe two-vessel CAD with sequential 80-90% proximal and 70% mid LAD stenoses, as well as CTO of RCA. 2. Normal left ventricular filling pressure. 3. Successful orbital atherectomy and IVUS-guided PCI to LAD using Resolute Onyx 3.5 x 12 mm (proximal) and 3.0 x 18 mm (mid) with 0% residual stenosis and TIMI-3 flow.  LHC (04/07/2018): 4. Severe two-vessel coronary artery disease, including sequential 80-90% proximal and 70% mid LAD stenoses with heavy calcification, as well as chronic total occlusion of the mid RCA with left-to-right and bridging collaterals. 5. Mild to  moderate, non-obstructive LCx/OM disease. 6. Basal inferior hypokinesis with otherwise preserved left ventricular systolic function (LVEF 55-65%). 7. Normal left ventricular filling pressure.  Echo (12/10/2017): - Left ventricle: The cavity size was normal. Wall thickness was   normal. Systolic function was normal. The estimated ejection   fraction was in the range of 60% to 65%. Wall motion was normal;   there were no regional wall motion abnormalities. Features are   consistent with a pseudonormal left ventricular filling pattern,   with concomitant abnormal relaxation and increased filling   pressure (grade 2 diastolic dysfunction).  Laboratory Data:  Chemistry Recent Labs  Lab 04/11/18 1003 04/12/18  0238  NA 136 135  K 4.4 3.7  CL 103 101  CO2 23 23  GLUCOSE 104* 108*  BUN 11 10  CREATININE 0.86 0.85  CALCIUM 9.1 8.8*  GFRNONAA >60 >60  GFRAA >60 >60  ANIONGAP 10 11    No results for input(s): PROT, ALBUMIN, AST, ALT, ALKPHOS, BILITOT in the last 168 hours. Hematology Recent Labs  Lab 04/11/18 1701 04/12/18 0238 04/12/18 1250  WBC 7.6 8.7 10.6*  RBC 4.53 4.37 4.52  HGB 13.2 12.6* 13.2  HCT 39.5 37.7* 39.3  MCV 87.2 86.3 86.9  MCH 29.1 28.8 29.2  MCHC 33.4 33.4 33.6  RDW 12.0 11.9 12.0  PLT 262 245 256   Cardiac EnzymesNo results for input(s): TROPONINI in the last 168 hours. No results for input(s): TROPIPOC in the last 168 hours.  BNPNo results for input(s): BNP, PROBNP in the last 168 hours.  DDimer No results for input(s): DDIMER in the last 168 hours.  Radiology/Studies:  No results found.  Assessment and Plan:   Near-syncope Most likely related to transient hypotension after receiving first dose of isosorbide mononitrate this morning just before discharge.  Headache is most likely due to this as well.  He feels back to baseline at this time.  Lack of chest pain and stable EKG without acute ischemic changes argues against ACS.  I agree with hydration  and monitoring in the ED.  I have spoken with Dr. Anne Fu and we agree that isosorbide mononitrate and metoprolol should be held until Mr. Schmalzried follows up with Dr. Anne Fu in the office on 04/24/2018.  If no significant abnormalities are seen on ED workup and Mr. Rathgeb is able to ambulate without lightheadedness, chest pain, or shortness of breath, I think he stable for discharge home.  CAD No symptoms to suggest ACS following PCI to proximal and mid LAD yesterday.  We will hold isosorbide mononitrate and metoprolol, as above.  Continue ASA, clopidogrel, and atorvastatin.  [facility] will sign off.   Medication Recommendations:  Stop metoprolol and isosorbide mononitrate.  Continue ASA, clopidogrel, and atorvastatin. Other recommendations (labs, testing, etc):  IVF hydration. Follow up as an outpatient:  Scheduled to see Dr. Anne Fu on 04/24/2018.  For questions or updates, please contact [facility] Please consult [email or web address] for contact info under   Signed, Yvonne Kendall, MD  04/12/2018 1:03 PM

## 2018-04-12 NOTE — ED Notes (Signed)
Ambulated pt in hallway to restroom with steady gait. Audrey(RN) witnessed.

## 2018-04-12 NOTE — ED Triage Notes (Signed)
Pt has been informed multiple times a urine sample is needed. Pt response was he can not pee at this time.

## 2018-04-12 NOTE — ED Triage Notes (Signed)
PT DC today and on the way home became pale ,dizzy , weak and diaphoretic. Pt reports he did not have CP . Wife called  911 from gas station . EMS reported pt did not have  CP.

## 2018-04-12 NOTE — ED Notes (Signed)
Patient verbalizes understanding of discharge instructions. Opportunity for questioning and answers were provided. Armband removed by staff, pt discharged from ED home via POV with family. 

## 2018-04-12 NOTE — Discharge Summary (Addendum)
Discharge Summary    Patient ID: Brent Carson,  MRN: [id number], DOB/AGE: [birth year] 48 y.o.  Admit date: 04/11/2018 Discharge date: 04/12/2018  Primary Care Provider: Heide Scales Primary Cardiologist: Dr. Anne Fu  Discharge Diagnoses    Principal Problem:   Stable angina (HCC)   Allergies Allergies  Allergen Reactions  . Simcor [Niacin-Simvastatin Er]     Flushing and chest pain    Diagnostic Studies/Procedures    Cath: 04/11/2018  Conclusions: 1. Severe two-vessel CAD with sequential 80-90% proximal and 70% mid LAD stenoses, as well as CTO of RCA. 2. Normal left ventricular filling pressure. 3. Successful orbital atherectomy and IVUS-guided PCI to LAD using Resolute Onyx 3.5 x 12 mm (proximal) and 3.0 x 18 mm (mid) with 0% residual stenosis and TIMI-3 flow.  Recommendations: 1. Overnight observation. 2. Indefinite dual antiplatelet therapy with aspirin and clopidogrel. 3. Aggressive secondary prevention and medical management of CTO of RCA.  Yvonne Kendall, MD _____________   History of Present Illness     48 y.o. male who presented to the office for follow-up of abnormal CT scan demonstrating LAD and RCA disease.  He recently was in the office for evaluation of dyspnea at the request of Dr. Levora Angel.  He had been experiencing dyspnea off and on.  Hemoglobin was 14.4.  Father had a stroke.  Former smoker quit in 2019.  Used to smoke about 1-1/2 packs/week. Occasional discomfort in his left shoulder. SOB, had to stop and gather himself. With or without exertion. Once per day, usually with going to the mailbox  Overall he was concerned about the symptoms.  CT scan did show LAD and RCA disease with positive FFR analysis. Given this finding and symptoms he was set up for outpatient cardiac cath. Underwent cardiac cath on 2/17 and noted to have multivessel disease with sequential 80% and 70% proximal and mid LAD stenoses, mild-moderate diffuse LCx disease,  60-70% ostial OM2 stensosis, and CTO of mid RCA with left-to-right collaterals. He was started on Imdur  and brought back for atherectomy.   Hospital Course   Underwent successful PCI/DESx2 to the LAD with Dr. Okey Dupre. Plan for DAPT with ASA/plavix. May need lifelong plavix. Continue medical management of CTO to the RCA. He will continue on BB, ACEi and statin. Worked well with cardiac rehab. No recurrent chest pain.   Brent Carson was seen by Dr. Anne Fu and determined stable for discharge home. Follow up in the office has been arranged. Medications are listed below.   _____________  Discharge Vitals Blood pressure 130/79, pulse 62, temperature (!) 97.5 F (36.4 C), temperature source Oral, resp. rate 13, height  (1.702 m), weight 78.9 kg, SpO2 98 %.  Filed Weights   04/11/18 0953 04/12/18 0323  Weight: 80.7 kg 78.9 kg    Labs & Radiologic Studies    CBC Recent Labs    04/11/18 1701 04/12/18 0238  WBC 7.6 8.7  HGB 13.2 12.6*  HCT 39.5 37.7*  MCV 87.2 86.3  PLT 262 245   Basic Metabolic Panel Recent Labs    40/98/11 1003 04/12/18 0238  NA 136 135  K 4.4 3.7  CL 103 101  CO2 23 23  GLUCOSE 104* 108*  BUN 11 10  CREATININE 0.86 0.85  CALCIUM 9.1 8.8*   Liver Function Tests No results for input(s): AST, ALT, ALKPHOS,Corie Vavra PROT, [id number] last 72 hours. No results for input(s): LIPASE, AMYLASE in the last 72 hours. Cardiac Enzymes No results for  input(s): CKTOTAL, CKMB, CKMBINDEX, TROPONINI in the last 72 hours. BNP Invalid input(s): POCBNP D-Dimer No results for input(s): DDIMER in the last 72 hours. Hemoglobin A1C No results for input(s): HGBA1C in the last 72 hours. Fasting Lipid Panel No results for input(s): CHOL, HDL, LDLCALC, TRIG, CHOLHDL, LDLDIRECT in the last 72 hours. Thyroid Function Tests No results for input(s): TSH, T4TOTAL, T3FREE, THYROIDAB in the last 72 hours.  Invalid input(s): FREET3 _____________  Ct Coronary Morph W/cta  Cor W/score W/ca W/cm &/or Wo/cm  Addendum Date: 03/28/2018   ADDENDUM REPORT: 03/28/2018 10:17 CLINICAL DATA:  Chest pain EXAM: Cardiac CTA MEDICATIONS: Sub lingual nitro. 4mg  x 2 TECHNIQUE: The patient was scanned on a Siemens 192 slice scanner. Gantry rotation speed was 250 msecs. Collimation was 0.6 mm. A 100 kV prospective scan was triggered in the ascending thoracic aorta at 35-75% of the R-R interval. Average HR during the scan was 60 bpm. The 3D data set was interpreted on a dedicated work station using MPR, MIP and VRT modes. A total of 80cc of contrast was used. FINDINGS: Non-cardiac: See separate report from [facility]. Pulmonary veins drain normally to the left atrium. Calcium Score: 3254 Agatston units. Coronary Arteries: Right dominant with no anomalies LM: No plaque or stenosis. LAD system: Extensive mixed plaque in the proximal LAD, there appears to be severe (70-90%) stenosis. Circumflex system: Extensive mixed plaque in the LCx system. Small OM1 with probably 50% proximal stenosis. Moderate OM2 with mild (<50%) stenosis proximally. Degree of stenosis in the AV LCx does not appear to exceed 50%. RCA system: There is extensive mixed plaque in the mid to distal RCA. There appears to be critical (>90%) stenosis in the mid RCA and severe (70-90%) stenosis in the distal RCA. IMPRESSION: 1. Coronary artery calcium score 3254 Agatston units. This places the patient in the 99th percentile for age and gender, suggesting high risk for future cardiac events. 2.  Suspect severe proximal LAD stenosis (70-90%). 3. Suspect critical mid RCA stenosis (>90%) and severe distal RCA stenosis (70-90%). Will send for FFR but recommend cardiac cath. Dalton Mclean Electronically Signed   By: Marca Ancona M.D.   On: 03/28/2018 10:17   Result Date: 03/28/2018 EXAM: OVER-READ INTERPRETATION  CT CHEST The following report is an over-read performed by radiologist Dr. Genevive Bi of [facility] on  03/28/2018. This over-read does not include interpretation of cardiac or coronary anatomy or pathology. The coronary CTA interpretation by the cardiologist is attached. COMPARISON:  None. FINDINGS: Limited view of the lung parenchyma demonstrates no suspicious nodularity. Airways are normal. Limited view of the mediastinum demonstrates no adenopathy. Esophagus normal. Limited view of the upper abdomen unremarkable. Limited view of the skeleton and chest wall is unremarkable. IMPRESSION: No significant extracardiac findings Electronically Signed: By: Genevive Bi M.D. On: 03/28/2018 08:02   Ct Coronary Fractional Flow Reserve Fluid Analysis  Result Date: 03/31/2018 CLINICAL DATA:  Chest pain EXAM: CT FFR MEDICATIONS: No additional medications. TECHNIQUE: The coronary CTA was sent for FFR FINDINGS: FFR 0.72 mid LAD FFR 0.65 distal RCA, <0.5 distal RCA. FFR 0.97 mid LCX IMPRESSION: 1.  Hemodynamically significant proximal LAD stenosis. 2.  Hemodynamically significant mid RCA stenosis. Dalton Mclean Electronically Signed   By: Marca Ancona M.D.   On: 03/31/2018 17:18   Disposition   Pt is being discharged home today in good condition.  Follow-up Plans & Appointments    Follow-up Information    Jake Bathe, MD Follow up on  04/24/2018.   Specialty:  Cardiology Why:  at 11:40am for your follow up appt.  Contact information: [address] [place] [address] [phone]          Discharge Instructions    AMB Referral to Cardiac Rehabilitation - Phase II   Complete by:  As directed    Diagnosis:  Coronary Stents   Call MD for:  redness, tenderness, or signs of infection (pain, swelling, redness, odor or green/yellow discharge around incision site)   Complete by:  As directed    Diet - low sodium heart healthy   Complete by:  As directed    Discharge instructions   Complete by:  As directed    Radial Site Care Refer to this sheet in the next few weeks. These  instructions provide you with information on caring for yourself after your procedure. Your caregiver may also give you more specific instructions. Your treatment has been planned according to current medical practices, but problems sometimes occur. Call your caregiver if you have any problems or questions after your procedure. HOME CARE INSTRUCTIONS You may shower the day after the procedure.Remove the bandage (dressing) and gently wash the site with plain soap and water.Gently pat the site dry.  Do not apply powder or lotion to the site.  Do not submerge the affected site in water for 3 to 5 days.  Inspect the site at least twice daily.  Do not flex or bend the affected arm for 24 hours.  No lifting over 5 pounds (2.3 kg) for 5 days after your procedure.  Do not drive home if you are discharged the same day of the procedure. Have someone else drive you.  You may drive 24 hours after the procedure unless otherwise instructed by your caregiver.  What to expect: Any bruising will usually fade within 1 to 2 weeks.  Blood that collects in the tissue (hematoma) may be painful to the touch. It should usually decrease in size and tenderness within 1 to 2 weeks.  SEEK IMMEDIATE MEDICAL CARE IF: You have unusual pain at the radial site.  You have redness, warmth, swelling, or pain at the radial site.  You have drainage (other than a small amount of blood on the dressing).  You have chills.  You have a fever or persistent symptoms for more than 72 hours.  You have a fever and your symptoms suddenly get worse.  Your arm becomes pale, cool, tingly, or numb.  You have heavy bleeding from the site. Hold pressure on the site.   PLEASE DO NOT MISS ANY DOSES OF YOUR PLAVIX!!!!! Also keep a log of you blood pressures and bring back to your follow up appt. Please call the office with any questions.   Patients taking blood thinners should generally stay away from medicines like ibuprofen, Advil, Motrin,  naproxen, and Aleve due to risk of stomach bleeding. You may take Tylenol as directed or talk to your primary doctor about alternatives.   Increase activity slowly   Complete by:  As directed        Discharge Medications     Medication List    TAKE these medications   aspirin EC 81 MG tablet Take 1 tablet (81 mg total) by mouth daily.   atorvastatin 40 MG tablet Commonly known as:  LIPITOR Take 1 tablet (40 mg total) by mouth daily.   cholecalciferol 25 MCG (1000 UT) tablet Commonly known as:  VITAMIN D3 Take 1,000 Units by mouth  daily.   clopidogrel 75 MG tablet Commonly known as:  PLAVIX Take 1 tablet (75 mg total) by mouth daily with breakfast.   isosorbide mononitrate 30 MG 24 hr tablet Commonly known as:  IMDUR Take 1 tablet (30 mg total) by mouth daily.   metoprolol succinate 25 MG 24 hr tablet Commonly known as:  TOPROL-XL Take 1 tablet (25 mg total) by mouth daily.   nitroGLYCERIN 0.4 MG SL tablet Commonly known as:  NITROSTAT Place 1 tablet (0.4 mg total) under the tongue every 5 (five) minutes as needed for chest pain.   pantoprazole 40 MG tablet Commonly known as:  PROTONIX Take 40 mg by mouth daily.       Acute coronary syndrome (MI, NSTEMI, STEMI, etc) this admission?: No.     Outstanding Labs/Studies   N/a   Duration of Discharge Encounter   Greater than 30 minutes including physician time.  Signed, Laverda Page NP-C 04/12/2018, 8:58 AM  Personally seen and examined. Agree with above.   Date of Encounter: 04/12/2018  Primary Cardiologist: Donato Schultz, MD   Subjective   Post LAD PCI x2 with atherectomy.  Feeling well this morning.  About to get up and move around with cardiac rehab.  No chest pain fevers chills nausea vomiting syncope bleeding.  Left radial approach.  Inpatient Medications    Scheduled Meds: . aspirin EC  81 mg Oral Daily  . atorvastatin  40 mg Oral Daily  . clopidogrel  75 mg Oral Q breakfast  .  enoxaparin (LOVENOX) injection  40 mg Subcutaneous Q24H  . isosorbide mononitrate  30 mg Oral Daily  . metoprolol succinate  25 mg Oral Daily  . pantoprazole  40 mg Oral Daily  . sodium chloride flush  3 mL Intravenous Q12H   Continuous Infusions: . sodium chloride     PRN Meds: sodium chloride, acetaminophen, nitroGLYCERIN, ondansetron (ZOFRAN) IV, sodium chloride flush   Vital Signs          Vitals:   04/11/18 2000 04/11/18 2100 04/12/18 0323 04/12/18 0740  BP: 119/67  127/88 130/79  Pulse: 76 73 64 62  Resp: 13 19 16 13   Temp:   98.2 F (36.8 C) (!) 97.5 F (36.4 C)  TempSrc:   Oral Oral  SpO2: 97% 97% 97% 98%  Weight:   78.9 kg   Height:        Intake/Output Summary (Last 24 hours) at 04/12/2018 0834 Last data filed at 04/12/2018 0742    Gross per 24 hour  Intake 220 ml  Output 950 ml  Net -730 ml   Last 3 Weights 04/12/2018 04/11/2018 04/07/2018  Weight (lbs) 173 lb 15.1 oz 178 lb 178 lb  Weight (kg) 78.9 kg 80.74 kg 80.74 kg      Telemetry    Sinus rhythm no adverse arrhythmias- Personally Reviewed  ECG    Sinus rhythm with nonspecific ST-T wave changes, septal infarct pattern- Personally Reviewed  Physical Exam   GEN:No acute distress.   Neck:No JVD Cardiac:RRR, no murmurs, rubs, or gallops.  Respiratory:Clear to auscultation bilaterally. RA:QTMA, nontender, non-distended  MS:No edema; No deformity.  Cardiac catheterization site left radial appears normal. Neuro:Nonfocal  Psych: Normal affect   Labs    Chemistry LastLabs      Recent Labs  Lab 04/11/18 1003 04/12/18 0238  NA 136 135  K 4.4 3.7  CL 103 101  CO2 23 23  GLUCOSE 104* 108*  BUN 11 10  CREATININE 0.86 0.85  CALCIUM  9.1 8.8*  GFRNONAA >60 >60  GFRAA >60 >60  ANIONGAP 10 11       Hematology LastLabs       Recent Labs  Lab 04/11/18 1003 04/11/18 1701 04/12/18 0238  WBC 6.5 7.6 8.7  RBC 4.72 4.53 4.37  HGB 13.9 13.2 12.6*  HCT  41.3 39.5 37.7*  MCV 87.5 87.2 86.3  MCH 29.4 29.1 28.8  MCHC 33.7 33.4 33.4  RDW 11.9 12.0 11.9  PLT 259 262 245      Cardiac Enzymes LastLabs  No results for input(s): TROPONINI in the last 168 hours.    LastLabs  No results for input(s): TROPIPOC in the last 168 hours.     BNP LastLabs  No results for input(s): BNP, PROBNP in the last 168 hours.     DDimer  Beau FannyLastLabs  No results for input(s): DDIMER in the last 168 hours.     Radiology    ImagingResults(Last48hours)  No results found.    Cardiac Studies   4. Severe two-vessel CAD with sequential 80-90% proximal and 70% mid LAD stenoses, as well as CTO of RCA. 5. Normal left ventricular filling pressure. 6. Successful orbital atherectomy and IVUS-guided PCI to LAD using Resolute Onyx 3.5 x 12 mm (proximal) and 3.0 x 18 mm (mid) with 0% residual stenosis and TIMI-3 flow.  Recommendations: 4. Overnight observation. 5. Indefinite dual antiplatelet therapy with aspirin and clopidogrel. 6. Aggressive secondary prevention and medical management of CTO of RCA.  Patient Profile     48 y.o. male with recent accelerating angina symptoms with newly discovered severe coronary artery disease of LAD as well as RCA (CTO of RCA).  Came in for atherectomy of LAD and stent placement of LAD  Assessment & Plan    Coronary artery disease - Doing well post 2 DES to LAD.  See above for details.  Dr. end.  Continue with dual antiplatelet therapy for 1 year.  After that may continue Plavix lifelong. -Continue with medical management for his RCA CTO.  Consider isosorbide in the future.  Consider increasing metoprolol as fit. -Extensive discussion previously with interventional cardiology, cardiothoracic surgery and patient.  Please see prior history.  Joint decision was made to proceed with atherectomy of LAD.  Dyspnea -Hopefully this will improve status post revascularization of LAD.  Hyperlipidemia -  Atorvastatin recently increased to high intensity dose.  Continue to monitor lipids.  LDL goal less than 70.  Essential hypertension - Overall well controlled currently.  ACE inhibitor.  Abnormal EKG -Prior EKG showed more extensive T wave inversions in the precordial leads suggestive of ischemia.  These seem to have improved on latest EKG.  Nonspecific changes.  Former smoker - No longer smoking, no longer vaping.  Continue with cessation.  Cardiac rehabilitation.  Ejection fraction 60 to 65%.  Okay for discharge home.  He has close follow-up with me in March.  Donato SchultzMark Skains, MD   For questions or updates, please contact [facility] Please consult [email or web address] for contact info under        Signed, Donato SchultzMark Skains, MD

## 2018-04-12 NOTE — Discharge Instructions (Addendum)
It was our pleasure to provide your ER care today - we hope that you feel better.  Rest. Drink plenty of fluids.  Your cardiologist indicates for you to hold (ie. Do not take) your imdur and metoprolol, and to follow up with them closely in the office, as planned.   Return to ER if worse, new symptoms, fevers, fainting, chest pain, trouble breathing, other concern.

## 2018-04-12 NOTE — ED Notes (Signed)
This NT informed pt after all care has been done on pt. Pt will go to a progression bed to finish out their care in the ED and to free up this room for other pt's coming into the ED. Pt's visitor became very irritated about being brought out in hallway, asked this NT to explain results with scans and blood work. This NT stated "its not legal for this NT to discuss results with pt, only Dr's can". Pt understood, visitor still irritated. Dr. Denton Lank came in and discussed results with pt and about med's. Visitor at this time calmed down and understood. This NT apologized about not being able to discuss results and being taken into the hall, pt understood and visitor apologized. This NT notified pt that care will not change while in the hallway and this NT, Chasity(EMT) or Audrey(RN) will help any time they needed anything. Pt and visitor acknowledged.

## 2018-04-12 NOTE — ED Triage Notes (Signed)
PT provided with apple juice and happy meal.

## 2018-04-14 ENCOUNTER — Encounter (HOSPITAL_COMMUNITY): Payer: Self-pay | Admitting: Internal Medicine

## 2018-04-14 ENCOUNTER — Telehealth: Payer: Self-pay | Admitting: Internal Medicine

## 2018-04-14 ENCOUNTER — Telehealth: Payer: Self-pay | Admitting: Cardiology

## 2018-04-14 NOTE — Telephone Encounter (Signed)
Spoke with wife [place] (on [place]).  Advised per d/c instructions pt may drive 24 hours after the procedure.  Pt would like to know if OK to return to work (no heavy lifting required for his job).  Would prefer not to have to wait until after his f/u appt (04/24/2018) to return.  Advised I will obtain approval from Dr Anne Fu and call back.   Pt's BP this am was elevated at 152/97 and he restarted Metoprolol.  It, along with Imdur were being held d/t near-syncope after his recent procedure.  They will continue to monitor BP.

## 2018-04-14 NOTE — Telephone Encounter (Signed)
I spoke with Mr. Cutchin by phone to assess how he is feeling following PCI last week and readmission to the ED for lightheadedness after discharge.  He reports that he has been feeling better compared to before his PCI, though he still has various "aches and pains."  He has not had any significant chest pain.  Also, he denies further lightheadedness or headaches.  He should continue his current medications and follow-up as previously discussed with Dr. Anne Fu.  Yvonne Kendall, MD [facility] Pager: [phone]

## 2018-04-14 NOTE — Telephone Encounter (Signed)
  Wife is calling stating that they were not given any instructions upon leaving the hospital Friday after his craterization and two stents. They would like to know when he can drive and go back to work. She also stated they need to know when he can go back on his medications.

## 2018-04-14 NOTE — Telephone Encounter (Signed)
Discharge instructions   Complete by:  As directed     Radial Site Care Refer to this sheet in the next few weeks. These instructions provide you with information on caring for yourself after your procedure. Your caregiver may also give you more specific instructions. Your treatment has been planned according to current medical practices, but problems sometimes occur. Call your caregiver if you have any problems or questions after your procedure. HOME CARE INSTRUCTIONS You may shower the day after the procedure.Remove the bandage (dressing) and gently wash the site with plain soap and water.Gently pat the site dry.  Do not apply powder or lotion to the site.  Do not submerge the affected site in water for 3 to 5 days.  Inspect the site at least twice daily.  Do not flex or bend the affected arm for 24 hours.  No lifting over 5 pounds (2.3 kg) for 5 days after your procedure.  Do not drive home if you are discharged the same day of the procedure. Have someone else drive you.  You may drive 24 hours after the procedure unless otherwise instructed by your caregiver.  What to expect: Any bruising will usually fade within 1 to 2 weeks.  Blood that collects in the tissue (hematoma) may be painful to the touch. It should usually decrease in size and tenderness within 1 to 2 weeks.  SEEK IMMEDIATE MEDICAL CARE IF: You have unusual pain at the radial site.  You have redness, warmth, swelling, or pain at the radial site.  You have drainage (other than a small amount of blood on the dressing).  You have chills.  You have a fever or persistent symptoms for more than 72 hours.  You have a fever and your symptoms suddenly get worse.  Your arm becomes pale, cool, tingly, or numb.  You have heavy bleeding from the site. Hold pressure on the site.   PLEASE DO NOT MISS ANY DOSES OF YOUR PLAVIX!!!!! Also keep a log of you blood pressures and bring back to your follow up appt. Please call the office with  any questions.   Patients taking blood thinners should generally stay away from medicines like ibuprofen, Advil, Motrin, naproxen, and Aleve due to risk of stomach bleeding. You may take Tylenol as directed or talk to your primary doctor about alternatives.   Increase activity slowly   Complete by:  As directed    Had dizziness after d/c and Imdur/Metoprolol were to be help until pt sees Dr Anne Fu back in the office 04/24/2018.

## 2018-04-16 ENCOUNTER — Encounter: Payer: Self-pay | Admitting: *Deleted

## 2018-04-16 ENCOUNTER — Telehealth (HOSPITAL_COMMUNITY): Payer: Self-pay

## 2018-04-16 ENCOUNTER — Telehealth: Payer: Self-pay | Admitting: Cardiology

## 2018-04-16 NOTE — Telephone Encounter (Signed)
Pt insurance is active and benefits verified through [employer]. Co-pay $0.00, DED $5,000.00/$5,000.00 met, out of pocket $6,550.00/$6,288.47 met, co-insurance 20%. No pre-authorization required. Jaz[employer], 04/16/2018 @ 2:04, REF# [id number]

## 2018-04-16 NOTE — Telephone Encounter (Signed)
Spoke with patient who is requesting a return to work note.  Advised I can sent it through MyChart and he is agreeable.  He is asking about restrictions however his restrictions were no lifting over 5 lbs for 5 days after the procedure and no driving for the 1st 24 hours.   He has no restrictions at this time.

## 2018-04-16 NOTE — Progress Notes (Signed)
Return to work note

## 2018-04-16 NOTE — Telephone Encounter (Signed)
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 Donato Schultz, MD

## 2018-04-16 NOTE — Telephone Encounter (Signed)
Please see previous phone note for documentation   

## 2018-04-16 NOTE — Telephone Encounter (Signed)
New Message    Patient wants to know if a return to work note could be faxed or emailed to her.  Please call patient back.

## 2018-04-16 NOTE — Telephone Encounter (Signed)
Left message on VM - OK to return to work.  Requested they call back if further needs, questions or concerns.

## 2018-04-16 NOTE — Telephone Encounter (Signed)
Patient's wife called again to check on status of when husband can return to work.

## 2018-04-16 NOTE — Telephone Encounter (Signed)
    New Message    Patient wants to know if a return to work note could be faxed or emailed to her.  Please call patient back.      Documentation     Lucci,Brent Carson [phone]  Samuella Bruin

## 2018-04-22 ENCOUNTER — Telehealth (HOSPITAL_COMMUNITY): Payer: Self-pay

## 2018-04-22 NOTE — Telephone Encounter (Signed)
Attempted to call patient in regards to Cardiac Rehab - LM on VM 

## 2018-04-24 ENCOUNTER — Ambulatory Visit (INDEPENDENT_AMBULATORY_CARE_PROVIDER_SITE_OTHER): Payer: 59 | Admitting: Cardiology

## 2018-04-24 ENCOUNTER — Encounter: Payer: Self-pay | Admitting: Cardiology

## 2018-04-24 VITALS — BP 112/84 | HR 78 | Ht 67.0 in | Wt 171.0 lb

## 2018-04-24 DIAGNOSIS — I2510 Atherosclerotic heart disease of native coronary artery without angina pectoris: Secondary | ICD-10-CM

## 2018-04-24 DIAGNOSIS — I208 Other forms of angina pectoris: Secondary | ICD-10-CM

## 2018-04-24 DIAGNOSIS — Z87891 Personal history of nicotine dependence: Secondary | ICD-10-CM

## 2018-04-24 DIAGNOSIS — I2089 Other forms of angina pectoris: Secondary | ICD-10-CM

## 2018-04-24 DIAGNOSIS — I10 Essential (primary) hypertension: Secondary | ICD-10-CM | POA: Diagnosis not present

## 2018-04-24 NOTE — Patient Instructions (Signed)
Medication Instructions:  The current medical regimen is effective;  continue present plan and medications.  If you need a refill on your cardiac medications before your next appointment, please call your pharmacy.   Follow-Up: At [facility], you and your health needs are our priority.  As part of our continuing mission to provide you with exceptional heart care, we have created designated Provider Care Teams.  These Care Teams include your primary Cardiologist (physician) and Advanced Practice Providers (APPs -  Physician Assistants and Nurse Practitioners) who all work together to provide you with the care you need, when you need it. You will need a follow up appointment in 4 months.  Please call our office 2 months in advance to schedule this appointment.  You may see Mark Skains, MD or one of the following Advanced Practice Providers on your designated Care Team:   Lori Gerhardt, NP Laura Ingold, NP . Jill McDaniel, NP  Thank you for choosing [facility]!!      

## 2018-04-24 NOTE — Progress Notes (Signed)
Cardiology Office Note:    Date:  04/24/2018   ID:  Saadiq Poche, DOB May 30, 1970, MRN [id number]  PCP:  Heide Scales, PA-C  Cardiologist:  Donato Schultz, MD  Electrophysiologist:  None   Referring MD: Heide Scales, PA-C     History of Present Illness:    Brent Carson is a 48 y.o. male here for the follow-up of coronary artery disease, LAD atherectomy and stent placement.  When he was discharged from the hospital for this, he felt dizzy in the car and went back to the ER, was told to hold his isosorbide 30 mg.  Still having some shortness of breath and some pain between his shoulder blades.    This all originated after he had abnormal CT scan demonstrating LAD and RCA disease.  He recently was here for evaluation of dyspnea at the request of Dr. Levora Angel.   He has been experiencing dyspnea off and on.  Hemoglobin was 14.4.  Father had a stroke.  Former smoker quit in 2019.  Used to smoke about 1-1/2 packs/week.  Occasional discomfort in his left shoulder.  SOB, has to stop and gather himself. With or without exertion. Once per day. Going to mail box. [place].   Odd, maybe tightness. Stopped in store had to breath, subside. Duration 1-5 - 10.  minutes.  Syncope 8 years ago x 1. Outdoor music, bright sensation.   Overall he is concerned about the symptoms.  CT scan did show [place] LAD and RCA disease with positive FFR analysis.  Showed him pictures personally.  Concerned.  Currently not having any active discomfort.  Denies any bleeding fevers chills nausea vomiting syncope.  04/24/2018 -here for the follow-up of CAD.  Recent LAD intervention atherectomy.  His home blood pressure recordings are very elevated 145/95 for instance 142/103.  Here however his blood pressure was 112/84.  Going to be starting cardiac rehab.  He may only be able to do this for 1 month.  This will help [place] with his blood pressure readings as well.  He still having some shortness of breath with activity.  Some  discomfort in between his shoulder blades.  We discussed his intervention at length.    Past Medical History:  Diagnosis Date  . Chronic GERD   . Gastroesophageal reflux disease without esophagitis   . Hiatal hernia   . Hyperlipidemia   . Hypertension   . Idiopathic gout, right ankle and foot   . Low testosterone   . Reflux   . Tobacco dependence   . Vitamin D deficiency     Past Surgical History:  Procedure Laterality Date  . CORONARY ATHERECTOMY N/A 04/11/2018   Procedure: CORONARY ATHERECTOMY;  Surgeon: Yvonne Kendall, MD;  [facility];  Service: Cardiovascular;  Laterality: N/A;  . CORONARY STENT INTERVENTION N/A 04/11/2018   Procedure: CORONARY STENT INTERVENTION;  Surgeon: Yvonne Kendall, MD;  [facility]: [facility];  Service: Cardiovascular;  Laterality: N/A;  LAD   . INTRAVASCULAR ULTRASOUND/IVUS N/A 04/11/2018   Procedure: Intravascular Ultrasound/IVUS;  Surgeon: Yvonne Kendall, MD;  [facility];  Service: Cardiovascular;  Laterality: N/A;  LAD  . LEFT HEART CATH AND CORONARY ANGIOGRAPHY N/A 04/07/2018   Procedure: LEFT HEART CATH AND CORONARY ANGIOGRAPHY;  Surgeon: Yvonne Kendall, MD;  [facility];  Service: Cardiovascular;  Laterality: N/A;    Current Medications: Current Meds  Medication Sig  . aspirin EC 81 MG tablet Take 1 tablet (81 mg total)  by mouth daily.  Marland Kitchen atorvastatin (LIPITOR) 40 MG tablet Take 1 tablet (40 mg total) by mouth daily.  . clopidogrel (PLAVIX) 75 MG tablet Take 1 tablet (75 mg total) by mouth daily with breakfast.  . metoprolol succinate (TOPROL-XL) 25 MG 24 hr tablet Take 1 tablet (25 mg total) by mouth daily.  . nitroGLYCERIN (NITROSTAT) 0.4 MG SL tablet Place 1 tablet (0.4 mg total) under the tongue every 5 (five) minutes as needed for chest pain.  . pantoprazole (PROTONIX) 40 MG tablet Take 40 mg by mouth daily.     Allergies:   Simcor [niacin-simvastatin er]   Social  History   Socioeconomic History  . Marital status: Married    Spouse name: Not on file  . Number of children: Not on file  . Years of education: Not on file  . Highest education level: Not on file  Occupational History  . Occupation: [employer]  Social Needs  . Financial resource strain: Not on file  . Food insecurity:    Worry: Not on file    Inability: Not on file  . Transportation needs:    Medical: Not on file    Non-medical: Not on file  Tobacco Use  . Smoking status: Former Games developer  . Smokeless tobacco: Never Used  Substance and Sexual Activity  . Alcohol use: Yes    Comment: 2 per week  . Drug use: No  . Sexual activity: Not on file  Lifestyle  . Physical activity:    Days per week: Not on file    Minutes per session: Not on file  . Stress: Not on file  Relationships  . Social connections:    Talks on phone: Not on file    Gets together: Not on file    Attends religious service: Not on file    Active member of club or organization: Not on file    Attends meetings of clubs or organizations: Not on file    Relationship status: Not on file  Other Topics Concern  . Not on file  Social History Narrative  . Not on file     Family History: The patient's family history includes CVA in his father; Gout in his father; Hypertension in his father. There is no history of Colon cancer, Colon polyps, or Liver disease.  ROS:   Please see the history of present illness.    Does not eat well he states.  Has exhaustion at times.  Wakes up, short of breath.  All other systems reviewed and are negative.  EKGs/Labs/Other Studies Reviewed:    The following studies were reviewed today: Office notes lab work EKG CT scan.  I personally reviewed his CT scan analysis with him and his wife.  Showed him pictures of FFR analysis.  ECHO 12/10/17 - Left ventricle: The cavity size was normal. Wall thickness was   normal. Systolic function was normal. The estimated ejection    fraction was in the range of 60% to 65%. Wall motion was normal;   there were no regional wall motion abnormalities. Features are   consistent with a pseudonormal left ventricular filling pattern,   with concomitant abnormal relaxation and increased filling   pressure (grade 2 diastolic dysfunction).  Cath 04/07/18: Conclusions: 1. Severe two-vessel coronary artery disease, including sequential 80-90% proximal and 70% mid LAD stenoses with heavy calcification, as well as chronic total occlusion of the mid RCA with left-to-right and bridging collaterals. 2. Mild to moderate, non-obstructive LCx/OM disease. 3. Basal inferior  hypokinesis with otherwise preserved left ventricular systolic function (LVEF 55-65%). 4. Normal left ventricular filling pressure.  Recommendations: 1. Heart Team discussion regarding optimal revascularization strategy; options in include CABG (LIMA-LAD and SVG-distal RCA) and PCI to proximal/mid LAD (with atherectomy) and medical management of CTO of RCA. 2. Initiate isosorbide mononitrate 30 mg daily for antianginal therapy. 3. Aggressive secondary prevention.    Cath 04/11/18: 1. Severe two-vessel CAD with sequential 80-90% proximal and 70% mid LAD stenoses, as well as CTO of RCA. 2. Normal left ventricular filling pressure. 3. Successful orbital atherectomy and IVUS-guided PCI to LAD using Resolute Onyx 3.5 x 12 mm (proximal) and 3.0 x 18 mm (mid) with 0% residual stenosis and TIMI-3 flow.  Recommendations: 1. Overnight observation. 2. Indefinite dual antiplatelet therapy with aspirin and clopidogrel. 3. Aggressive secondary prevention and medical management of CTO of RCA.  EKG:  EKG is  ordered today.  The ekg ordered today demonstrates 04/02/2018-sinus rhythm 88 possible inferior infarct pattern T wave inversion noted in the precordial leads concerning for ischemia.  Personally reviewed 02/26/2018-sinus rhythm 94 inferior Q waves noted T wave inversion noted in V3  V4 V5 possible anterolateral ischemia.  Recent Labs: 04/12/2018: ALT 36; BUN 8; Creatinine, Ser 0.86; Hemoglobin 13.2; Platelets 256; Potassium 3.9; Sodium 134  Recent Lipid Panel No results found for: CHOL, TRIG, HDL, CHOLHDL, VLDL, LDLCALC, LDLDIRECT  Physical Exam:    VS:  BP 112/84   Pulse 78   Ht 5\' 7"  (1.702 m)   Wt 171 lb (77.6 kg)   SpO2 98%   BMI 26.78 kg/m     Wt Readings from Last 3 Encounters:  04/24/18 171 lb (77.6 kg)  04/12/18 173 lb 15.1 oz (78.9 kg)  04/12/18 173 lb 15.1 oz (78.9 kg)     GEN: Well nourished, well developed, in no acute distress  HEENT: normal  Neck: no JVD, carotid bruits, or masses Cardiac: RRR; no murmurs, rubs, or gallops,no edema, excellent distal radial pulses.  He does have a mild protrusion at the arteriotomy site left radial, no significant pulsatility, no bruit heard, less than 1 cm diameter.  He states that it looks better than it did previously. Respiratory:  clear to auscultation bilaterally, normal work of breathing GI: soft, nontender, nondistended, + BS MS: no deformity or atrophy  Skin: warm and dry, no rash Neuro:  Alert and Oriented x 3, Strength and sensation are intact Psych: euthymic mood, full affect    ASSESSMENT:    1. Coronary artery disease involving native coronary artery of native heart without angina pectoris   2. Angina decubitus (HCC)   3. Essential hypertension   4. Former smoker    PLAN:    In order of problems listed above:  CAD/Dyspnea/angina/abnormal CT with mid LAD and mid to distal RCA stenosis, positive FFR, had atherectomy to his LAD with stent placement, Dr. Okey DupreEnd. -Prior longstanding smoking history, recently quit.  Generalized malaise.  He has had an echocardiogram that showed normal ejection fraction.  Blood pressure diastolic mildly elevated.  Continue to treat.  He fixes endoscopes. -Aspirin 81 mg, metoprolol 25 mg, atorvastatin 40 - I am okay with him holding off of Imdur at this time.  He  had a little bit of dizziness when he left the hospital.  Obviously if his blood pressures remain quite elevated, we can always try a 15 mg dose. -I am comfortable with him going to cardiac rehab.   Hyperlipidemia - Atorvastatin 20 mg has been increased to  40 mg, high intensity dose.  Prevention.  Does eat a lot of red meat.  We discussed dietary modifications.  Continue to monitor this.  Essential hypertension - Now on metoprolol.  Can always add isosorbide.   Abnormal EKG - T wave inversions noted in the precordial leads.  Inferior Q waves noted however there is no evidence of this on echocardiogram.  Given his RCA disease, likely old RCA infarct pattern.  Overall EF reassuring.  Former smoker - This sensation of dyspnea also was accompanying some vaping use at the time.  No longer smoking, no longer vaping.  Continue with cessation.    Medication Adjustments/Labs and Tests Ordered: Current medicines are reviewed at length with the patient today.  Concerns regarding medicines are outlined above.  No orders of the defined types were placed in this encounter.  No orders of the defined types were placed in this encounter.   Patient Instructions  Medication Instructions:  The current medical regimen is effective;  continue present plan and medications.  If you need a refill on your cardiac medications before your next appointment, please call your pharmacy.   Follow-Up: At [facility], you and your health needs are our priority.  As part of our continuing mission to provide you with exceptional heart care, we have created designated Provider Care Teams.  These Care Teams include your primary Cardiologist (physician) and Advanced Practice Providers (APPs -  Physician Assistants and Nurse Practitioners) who all work together to provide you with the care you need, when you need it. You will need a follow up appointment in 4 months.  Please call our office 2 months in advance to  schedule this appointment.  You may see Donato Schultz, MD or one of the following Advanced Practice Providers on your designated Care Team:   Norma Fredrickson, NP Nada Boozer, NP . Georgie Chard, NP  Thank you for choosing [facility]!!         Signed, Donato Schultz, MD  04/24/2018 2:53 PM    [facility]

## 2018-04-24 NOTE — Telephone Encounter (Signed)
Pt wife called and stated the he was interested in participating in the Cardiac Rehab Program. Patient stated yes. Patient will come in for orientation on 05/15/2018 @ 7:30am and will attend the 6:45am exercise class.  Mailed homework package. Tempie Donning. Support Rep II

## 2018-04-28 ENCOUNTER — Telehealth (HOSPITAL_COMMUNITY): Payer: Self-pay

## 2018-04-30 ENCOUNTER — Telehealth (HOSPITAL_COMMUNITY): Payer: Self-pay

## 2018-04-30 NOTE — Telephone Encounter (Signed)
Pt wife called and stated to run their insurance again because they should be covered at 100% because they have met everything. Reran pt insurance and they are covered at 100% called pt wife and confirmed.  Pt insurance is active and benefits verified through [employer] Co-pay 0, DED $5,000/$5,000 met, out of pocket $6,550/$6,550 met, co-insurance 0. no pre-authorization required, REF# [id number] Tedra [place]. Support Rep II

## 2018-05-05 ENCOUNTER — Telehealth (HOSPITAL_COMMUNITY): Payer: Self-pay

## 2018-05-05 NOTE — Telephone Encounter (Signed)
Called pt and left a vm to let pt know that the department is closed for the next 2 weeks.

## 2018-05-15 ENCOUNTER — Ambulatory Visit (HOSPITAL_COMMUNITY): Payer: 59

## 2018-05-16 ENCOUNTER — Telehealth: Payer: Self-pay | Admitting: Cardiology

## 2018-05-16 NOTE — Telephone Encounter (Signed)
Certification of Health form emailed to patient on 05/13/18. Sending original by mail to patient. 05/16/18 vlm

## 2018-05-19 ENCOUNTER — Ambulatory Visit (HOSPITAL_COMMUNITY): Payer: 59

## 2018-05-21 ENCOUNTER — Ambulatory Visit (HOSPITAL_COMMUNITY): Payer: 59

## 2018-05-23 ENCOUNTER — Ambulatory Visit (HOSPITAL_COMMUNITY): Payer: 59

## 2018-05-26 ENCOUNTER — Ambulatory Visit (HOSPITAL_COMMUNITY): Payer: 59

## 2018-05-28 ENCOUNTER — Ambulatory Visit (HOSPITAL_COMMUNITY): Payer: 59

## 2018-05-30 ENCOUNTER — Ambulatory Visit (HOSPITAL_COMMUNITY): Payer: 59

## 2018-06-02 ENCOUNTER — Ambulatory Visit (HOSPITAL_COMMUNITY): Payer: 59

## 2018-06-04 ENCOUNTER — Ambulatory Visit (HOSPITAL_COMMUNITY): Payer: 59

## 2018-06-06 ENCOUNTER — Ambulatory Visit (HOSPITAL_COMMUNITY): Payer: 59

## 2018-06-09 ENCOUNTER — Ambulatory Visit (HOSPITAL_COMMUNITY): Payer: 59

## 2018-06-11 ENCOUNTER — Ambulatory Visit (HOSPITAL_COMMUNITY): Payer: 59

## 2018-06-13 ENCOUNTER — Ambulatory Visit (HOSPITAL_COMMUNITY): Payer: 59

## 2018-06-16 ENCOUNTER — Ambulatory Visit (HOSPITAL_COMMUNITY): Payer: 59

## 2018-06-18 ENCOUNTER — Ambulatory Visit (HOSPITAL_COMMUNITY): Payer: 59

## 2018-06-19 ENCOUNTER — Telehealth (HOSPITAL_COMMUNITY): Payer: Self-pay

## 2018-06-20 ENCOUNTER — Ambulatory Visit (HOSPITAL_COMMUNITY): Payer: 59

## 2018-06-23 ENCOUNTER — Ambulatory Visit (HOSPITAL_COMMUNITY): Payer: 59

## 2018-06-25 ENCOUNTER — Ambulatory Visit (HOSPITAL_COMMUNITY): Payer: 59

## 2018-06-25 ENCOUNTER — Telehealth (HOSPITAL_COMMUNITY): Payer: Self-pay | Admitting: *Deleted

## 2018-06-25 NOTE — Telephone Encounter (Signed)
Received call back from Pt wife -Lawson Fiscal.  They are interested in participating in cardiac rehab when we are permitted to schedule.  Will send education information for prior review. Alanson Aly, BSN Cardiac and Emergency planning/management officer

## 2018-06-27 ENCOUNTER — Ambulatory Visit (HOSPITAL_COMMUNITY): Payer: 59

## 2018-06-30 ENCOUNTER — Ambulatory Visit (HOSPITAL_COMMUNITY): Payer: 59

## 2018-07-02 ENCOUNTER — Ambulatory Visit (HOSPITAL_COMMUNITY): Payer: 59

## 2018-07-04 ENCOUNTER — Ambulatory Visit (HOSPITAL_COMMUNITY): Payer: 59

## 2018-07-07 ENCOUNTER — Ambulatory Visit (HOSPITAL_COMMUNITY): Payer: 59

## 2018-07-09 ENCOUNTER — Ambulatory Visit (HOSPITAL_COMMUNITY): Payer: 59

## 2018-07-11 ENCOUNTER — Ambulatory Visit (HOSPITAL_COMMUNITY): Payer: 59

## 2018-07-16 ENCOUNTER — Ambulatory Visit (HOSPITAL_COMMUNITY): Payer: 59

## 2018-07-18 ENCOUNTER — Ambulatory Visit (HOSPITAL_COMMUNITY): Payer: 59

## 2018-07-21 ENCOUNTER — Ambulatory Visit (HOSPITAL_COMMUNITY): Payer: 59

## 2018-07-23 ENCOUNTER — Ambulatory Visit (HOSPITAL_COMMUNITY): Payer: 59

## 2018-07-24 ENCOUNTER — Telehealth (HOSPITAL_COMMUNITY): Payer: Self-pay

## 2018-07-24 NOTE — Telephone Encounter (Signed)
Phone call made to Pt to provide information about virtual CR. Pt did not answer and a message was left for him to return call.

## 2018-07-25 ENCOUNTER — Ambulatory Visit (HOSPITAL_COMMUNITY): Payer: 59

## 2018-07-28 ENCOUNTER — Ambulatory Visit (HOSPITAL_COMMUNITY): Payer: 59

## 2018-07-29 ENCOUNTER — Telehealth (HOSPITAL_COMMUNITY): Payer: Self-pay | Admitting: *Deleted

## 2018-07-30 ENCOUNTER — Ambulatory Visit (HOSPITAL_COMMUNITY): Payer: 59

## 2018-08-01 ENCOUNTER — Ambulatory Visit (HOSPITAL_COMMUNITY): Payer: 59

## 2018-08-04 ENCOUNTER — Ambulatory Visit (HOSPITAL_COMMUNITY): Payer: 59

## 2018-08-06 ENCOUNTER — Ambulatory Visit (HOSPITAL_COMMUNITY): Payer: 59

## 2018-08-08 ENCOUNTER — Ambulatory Visit (HOSPITAL_COMMUNITY): Payer: 59

## 2018-08-11 ENCOUNTER — Ambulatory Visit (HOSPITAL_COMMUNITY): Payer: 59

## 2018-08-13 ENCOUNTER — Ambulatory Visit (HOSPITAL_COMMUNITY): Payer: 59

## 2018-08-15 ENCOUNTER — Ambulatory Visit (HOSPITAL_COMMUNITY): Payer: 59

## 2018-08-18 ENCOUNTER — Ambulatory Visit (HOSPITAL_COMMUNITY): Payer: 59

## 2018-08-20 ENCOUNTER — Ambulatory Visit (HOSPITAL_COMMUNITY): Payer: 59

## 2018-08-28 ENCOUNTER — Telehealth (HOSPITAL_COMMUNITY): Payer: Self-pay

## 2018-08-28 NOTE — Telephone Encounter (Signed)
Pt insurance is active and benefits verified through [employer]. Co-pay $0.00, DED $5,000.00/$5,000.00 met, out of pocket $6,550.00/$6,550.00 met, co-insurance 20%. No pre-authorization required. Jay/[employer], 08/28/2018 @ 2:05PM, NOB#[id number]

## 2018-08-29 ENCOUNTER — Telehealth (HOSPITAL_COMMUNITY): Payer: Self-pay

## 2018-09-18 ENCOUNTER — Ambulatory Visit (INDEPENDENT_AMBULATORY_CARE_PROVIDER_SITE_OTHER): Payer: 59 | Admitting: Cardiology

## 2018-09-18 ENCOUNTER — Encounter: Payer: Self-pay | Admitting: Cardiology

## 2018-09-18 ENCOUNTER — Other Ambulatory Visit: Payer: Self-pay

## 2018-09-18 VITALS — BP 124/82 | HR 82 | Ht 67.0 in | Wt 154.0 lb

## 2018-09-18 DIAGNOSIS — I2510 Atherosclerotic heart disease of native coronary artery without angina pectoris: Secondary | ICD-10-CM

## 2018-09-18 DIAGNOSIS — E782 Mixed hyperlipidemia: Secondary | ICD-10-CM

## 2018-09-18 DIAGNOSIS — I10 Essential (primary) hypertension: Secondary | ICD-10-CM

## 2018-09-18 NOTE — Progress Notes (Signed)
Cardiology Office Note   Date:  09/18/2018   ID:  Brent BuccoDennis Kovalenko, DOB 1970/07/04, MRN [id number]  PCP:  Heide ScalesNelson, Kristen M, PA-C  Cardiologist:  Dr. Anne FuSkains    Chief Complaint  Patient presents with  . Coronary Artery Disease      History of Present Illness: Brent Carson is a 48 y.o. male who presents for CAD.   He has a hx of coronary artery disease, LAD atherectomy and stent placement.  When he was discharged from the hospital for this, he felt dizzy in the car and went back to the ER, was told to hold his isosorbide 30 mg.  Still having some shortness of breath and some pain between his shoulder blades    This all originated after he had abnormal CT scan demonstrating LAD and RCA disease.  He recently was here for evaluation of dyspnea at the request of Dr. Levora AngelBrahmbhatt.   He has been experiencing dyspnea off and on.  Hemoglobin was 14.4.  Father had a stroke.  Former smoker quit in 2019.  Used to smoke about 1-1/2 packs/week.  Occasional discomfort in his left shoulder.  SOB, has to stop and gather himself. With or without exertion. Once per day. Going to mail box. [place].   Odd, maybe tightness. Stopped in store had to breath, subside. Duration 1-5 - 10.  minutes.  Syncope 8 years ago x 1. Outdoor music, bright sensation.   CT scan did show us LAD and RCA disease with positive FFR analysis.  Showed him pictures personally.  Concerned.  Currently not having any active discomfort.  Denies any bleeding fevers chills nausea vomiting syncope.  04/24/2018 -here for the follow-up of CAD.  Recent LAD intervention atherectomy 03/2018.  His home blood pressure recordings are very elevated 145/95 for instance 142/103.  Here however his blood pressure was 112/84.  Going to be starting cardiac rehab.  He may only be able to do this for 1 month.  This will help us with his blood pressure readings as well.  He still having some shortness of breath with activity.  Some discomfort in between his  shoulder blades.  Dr. Anne FuSkains discussed his intervention at length.  Today his BP is 124/82.  He is asking how long on ASA and plavix due to bruising.  At least a year and Dr. Serita KyleEnd's cath note stated indefinitely.   Discussed.  He is still having episodes- exhaustion, which he had before cath.  They are less freq now. maybe twice a week.  He is exercising on recumbent bike. Usually a mile per day but yesterday he went 13 miles and no episodes.   Unable to tolerate imdur.  Needs hepatic and lipids.   Past Medical History:  Diagnosis Date  . Chronic GERD   . Gastroesophageal reflux disease without esophagitis   . Hiatal hernia   . Hyperlipidemia   . Hypertension   . Idiopathic gout, right ankle and foot   . Low testosterone   . Reflux   . Tobacco dependence   . Vitamin D deficiency     Past Surgical History:  Procedure Laterality Date  . CORONARY ATHERECTOMY N/A 04/11/2018   Procedure: CORONARY ATHERECTOMY;  Surgeon: Yvonne KendallEnd, Christopher, MD;  [facility];  Service: Cardiovascular;  Laterality: N/A;  . CORONARY STENT INTERVENTION N/A 04/11/2018   Procedure: CORONARY STENT INTERVENTION;  Surgeon: Yvonne KendallEnd, Christopher, MD;  [facility];  Service: Cardiovascular;  Laterality: N/A;  LAD   . INTRAVASCULAR  ULTRASOUND/IVUS N/A 04/11/2018   Procedure: Intravascular Ultrasound/IVUS;  Surgeon: Yvonne KendallEnd, Christopher, MD;  [facility];  Service: Cardiovascular;  Laterality: N/A;  LAD  . LEFT HEART CATH AND CORONARY ANGIOGRAPHY N/A 04/07/2018   Procedure: LEFT HEART CATH AND CORONARY ANGIOGRAPHY;  Surgeon: Yvonne KendallEnd, Christopher, MD;  [facility]: [facility];  Service: Cardiovascular;  Laterality: N/A;     Current Outpatient Medications  Medication Sig Dispense Refill  . aspirin EC 81 MG tablet Take 1 tablet (81 mg total) by mouth daily. 90 tablet 3  . atorvastatin (LIPITOR) 40 MG tablet Take 1 tablet (40 mg total) by mouth daily. 90 tablet 3  . clopidogrel  (PLAVIX) 75 MG tablet Take 1 tablet (75 mg total) by mouth daily with breakfast. 90 tablet 2  . metoprolol succinate (TOPROL-XL) 25 MG 24 hr tablet Take 1 tablet (25 mg total) by mouth daily. 90 tablet 3  . nitroGLYCERIN (NITROSTAT) 0.4 MG SL tablet Place 1 tablet (0.4 mg total) under the tongue every 5 (five) minutes as needed for chest pain. 30 tablet 1  . pantoprazole (PROTONIX) 40 MG tablet Take 40 mg by mouth daily.     No current facility-administered medications for this visit.     Allergies:   Simcor [niacin-simvastatin er]    Social History:  The patient  reports that he has quit smoking. He has never used smokeless tobacco. He reports current alcohol use. He reports that he does not use drugs.   Family History:  The patient's family history includes CVA in his father; Gout in his father; Hypertension in his father.    ROS:  General:no colds or fevers, no weight changes Skin:no rashes or ulcers HEENT:no blurred vision, no congestion CV:see HPI PUL:see HPI GI:no diarrhea constipation or melena, no indigestion GU:no hematuria, no dysuria MS:no joint pain, no claudication Neuro:no syncope, no lightheadedness Endo:no diabetes, no thyroid disease  Wt Readings from Last 3 Encounters:  09/18/18 154 lb (69.9 kg)  04/24/18 171 lb (77.6 kg)  04/12/18 173 lb 15.1 oz (78.9 kg)     PHYSICAL EXAM: VS:  BP 124/82   Pulse 82   Ht 5\' 7"  (1.702 m)   Wt 154 lb (69.9 kg)   SpO2 97%   BMI 24.12 kg/m  , BMI Body mass index is 24.12 kg/m. General:Pleasant affect, NAD Skin:Warm and dry, brisk capillary refill HEENT:normocephalic, sclera clear, mucus membranes moist Neck:supple, no JVD, no bruits  Heart:S1S2 RRR without murmur, gallup, rub or click Lungs:clear without rales, rhonchi, or wheezes ZOX:WRUEAbd:soft, non tender, + BS, do not palpate liver spleen or masses Ext:no lower ext edema, 2+ pedal pulses, 2+ radial pulses Neuro:alert and oriented, MAE, follows commands, + facial symmetry     EKG:  EKG is NOT ordered today.   Recent Labs: 04/12/2018: ALT 36; BUN 8; Creatinine, Ser 0.86; Hemoglobin 13.2; Platelets 256; Potassium 3.9; Sodium 134    Lipid Panel No results found for: CHOL, TRIG, HDL, CHOLHDL, VLDL, LDLCALC, LDLDIRECT     Other studies Reviewed: Additional studies/ records that were reviewed today include: .  Cardiac cath 04/07/18 Conclusions: 1. Severe two-vessel coronary artery disease, including sequential 80-90% proximal and 70% mid LAD stenoses with heavy calcification, as well as chronic total occlusion of the mid RCA with left-to-right and bridging collaterals. 2. Mild to moderate, non-obstructive LCx/OM disease. 3. Basal inferior hypokinesis with otherwise preserved left ventricular systolic function (LVEF 55-65%). 4. Normal left ventricular filling pressure.  Recommendations: 1. Heart Team discussion regarding optimal  revascularization strategy; options in include CABG (LIMA-LAD and SVG-distal RCA) and PCI to proximal/mid LAD (with atherectomy) and medical management of CTO of RCA. 2. Initiate isosorbide mononitrate 30 mg daily for antianginal therapy. 3. Aggressive secondary prevention  04/11/18 atherectomy  Conclusions: 1. Severe two-vessel CAD with sequential 80-90% proximal and 70% mid LAD stenoses, as well as CTO of RCA. 2. Normal left ventricular filling pressure. 3. Successful orbital atherectomy and IVUS-guided PCI to LAD using Resolute Onyx 3.5 x 12 mm (proximal) and 3.0 x 18 mm (mid) with 0% residual stenosis and TIMI-3 flow.  Recommendations: 1. Overnight observation. 2. Indefinite dual antiplatelet therapy with aspirin and clopidogrel. 3. Aggressive secondary prevention and medical management of CTO of RCA.  ASSESSMENT AND PLAN:  1.  CAD with total RCA and stents to LAD, and non obstructive disease.  On statin, BB asa and plavix.  Discussed how ASA and plavix keep stents from clotting.    2.  HLD on statin, will check  levels  3. HTN controlled continue meds  Keep follow up with Dr. Marlou Porch.   Current medicines are reviewed with the patient today.  The patient Has no concerns regarding medicines.  The following changes have been made:  See above Labs/ tests ordered today include:see above  Disposition:   FU:  see above  Signed, Cecilie Kicks, NP  09/18/2018 4:09 PM    [facility] [address], [place], [place] Phone: [phone]; Fax: [phone]

## 2018-09-18 NOTE — Patient Instructions (Addendum)
Medication Instructions:   Your physician recommends that you continue on your current medications as directed. Please refer to the Current Medication list given to you today.  If you need a refill on your cardiac medications before your next appointment, please call your pharmacy.   Lab work: FASTING LABS LFT AND LIPIDS NEXT WEEK   If you have labs (blood work) drawn today and your tests are completely normal, you will receive your results only by: Marland Kitchen MyChart Message (if you have MyChart) OR . A paper copy in the mail If you have any lab test that is abnormal or we need to change your treatment, we will call you to review the results.  Testing/Procedures: NONE ORDERED  TODAY   Follow-Up: AS SCHEDULED WITH DR Marlou Porch   Any Other Special Instructions Will Be Listed Below (If Applicable).

## 2018-09-25 ENCOUNTER — Other Ambulatory Visit: Payer: 59 | Admitting: *Deleted

## 2018-09-25 ENCOUNTER — Other Ambulatory Visit: Payer: Self-pay

## 2018-09-25 DIAGNOSIS — I2510 Atherosclerotic heart disease of native coronary artery without angina pectoris: Secondary | ICD-10-CM

## 2018-09-25 LAB — HEPATIC FUNCTION PANEL
ALT: 49 IU/L — ABNORMAL HIGH (ref 0–44)
AST: 46 IU/L — ABNORMAL HIGH (ref 0–40)
Albumin: 5.3 g/dL — ABNORMAL HIGH (ref 4.0–5.0)
Alkaline Phosphatase: 108 IU/L (ref 39–117)
Bilirubin Total: 0.8 mg/dL (ref 0.0–1.2)
Bilirubin, Direct: 0.23 mg/dL (ref 0.00–0.40)
Total Protein: 7.5 g/dL (ref 6.0–8.5)

## 2018-09-25 LAB — LIPID PANEL
Chol/HDL Ratio: 2.8 ratio (ref 0.0–5.0)
Cholesterol, Total: 127 mg/dL (ref 100–199)
HDL: 46 mg/dL (ref >39)
LDL Calculated: 57 mg/dL (ref 0–99)
Triglycerides: 119 mg/dL (ref 0–149)
VLDL Cholesterol Cal: 24 mg/dL (ref 5–40)

## 2018-09-26 ENCOUNTER — Telehealth: Payer: Self-pay | Admitting: *Deleted

## 2018-09-26 DIAGNOSIS — Z79899 Other long term (current) drug therapy: Secondary | ICD-10-CM

## 2018-09-26 NOTE — Telephone Encounter (Signed)
-----   Message from Isaiah Serge, NP sent at 09/26/2018  2:36 PM EDT ----- Cholesterol is great, a hair change in liver studies but very minimal, lets not change meds, and to be careful to recheck hepatic panel in 4 weeks.

## 2018-09-26 NOTE — Telephone Encounter (Signed)
Call placed to pt re: lab results, answering machine came on and hung up, unable to leave message.

## 2018-10-21 ENCOUNTER — Other Ambulatory Visit: Payer: Self-pay | Admitting: Gastroenterology

## 2018-10-21 DIAGNOSIS — R1310 Dysphagia, unspecified: Secondary | ICD-10-CM

## 2018-10-21 DIAGNOSIS — R1319 Other dysphagia: Secondary | ICD-10-CM

## 2018-10-24 ENCOUNTER — Ambulatory Visit
Admission: RE | Admit: 2018-10-24 | Discharge: 2018-10-24 | Disposition: A | Discharge status: Home / Self Care | Payer: 59 | Source: Ambulatory Visit | Attending: Gastroenterology | Admitting: Gastroenterology

## 2018-10-24 DIAGNOSIS — R1319 Other dysphagia: Secondary | ICD-10-CM

## 2018-10-24 DIAGNOSIS — R1310 Dysphagia, unspecified: Secondary | ICD-10-CM

## 2018-11-07 ENCOUNTER — Telehealth: Payer: Self-pay

## 2018-11-07 NOTE — Telephone Encounter (Signed)
Called pt to switch from OV to VIRTUAL for MS 11/10/2018. Left message asking pt to call the office.

## 2018-11-10 ENCOUNTER — Other Ambulatory Visit: Payer: Self-pay

## 2018-11-10 ENCOUNTER — Other Ambulatory Visit: Payer: 59 | Admitting: *Deleted

## 2018-11-10 ENCOUNTER — Encounter: Payer: Self-pay | Admitting: Cardiology

## 2018-11-10 ENCOUNTER — Telehealth (INDEPENDENT_AMBULATORY_CARE_PROVIDER_SITE_OTHER): Payer: 59 | Admitting: Cardiology

## 2018-11-10 VITALS — Wt 149.0 lb

## 2018-11-10 DIAGNOSIS — I208 Other forms of angina pectoris: Secondary | ICD-10-CM

## 2018-11-10 DIAGNOSIS — I2510 Atherosclerotic heart disease of native coronary artery without angina pectoris: Secondary | ICD-10-CM | POA: Diagnosis not present

## 2018-11-10 DIAGNOSIS — E782 Mixed hyperlipidemia: Secondary | ICD-10-CM

## 2018-11-10 DIAGNOSIS — Z79899 Other long term (current) drug therapy: Secondary | ICD-10-CM

## 2018-11-10 DIAGNOSIS — I10 Essential (primary) hypertension: Secondary | ICD-10-CM | POA: Diagnosis not present

## 2018-11-10 LAB — HEPATIC FUNCTION PANEL
ALT: 39 IU/L (ref 0–44)
AST: 32 IU/L (ref 0–40)
Albumin: 4.7 g/dL (ref 4.0–5.0)
Alkaline Phosphatase: 93 IU/L (ref 39–117)
Bilirubin Total: 0.7 mg/dL (ref 0.0–1.2)
Bilirubin, Direct: 0.21 mg/dL (ref 0.00–0.40)
Total Protein: 7.3 g/dL (ref 6.0–8.5)

## 2018-11-10 NOTE — Patient Instructions (Signed)
Medication Instructions:  Your physician recommends that you continue on your current medications as directed. Please refer to the Current Medication list given to you today.  If you need a refill on your cardiac medications before your next appointment, please call your pharmacy.   Lab work: none If you have labs (blood work) drawn today and your tests are completely normal, you will receive your results only by: Marland Kitchen MyChart Message (if you have MyChart) OR . A paper copy in the mail If you have any lab test that is abnormal or we need to change your treatment, we will call you to review the results.  Testing/Procedures: none  Follow-Up:Your physician wants you to follow-up in: 6 months with Dr. Marlou Porch. You will receive a reminder letter in the mail two months in advance. If you don't receive a letter, please call our office to schedule the follow-up appointment. [phone].  Any Other Special Instructions Will Be Listed Below (If Applicable)

## 2018-11-10 NOTE — Progress Notes (Signed)
Cardiology Office Note:    Video visit- secondary to COVID-19 pandemic.  Education provided.   Date:  11/10/2018   ID:  Brent Buccoennis Pincock, DOB Jan 10, 1971, MRN [id number]  PCP:  Heide ScalesNelson, Kristen M, PA-C  Cardiologist:  Donato SchultzMark , MD  Electrophysiologist:  None   Referring MD: Heide ScalesNelson, Kristen M, PA-C     History of Present Illness:    Brent BuccoDennis Carson is a 48 y.o. male here for the follow-up of coronary artery disease, LAD atherectomy and stent placement.  When he was discharged from the hospital for this, he felt dizzy in the car and went back to the ER, was told to hold his isosorbide 30 mg.  Still having some shortness of breath and some pain between his shoulder blades.    This all originated after he had abnormal CT scan demonstrating LAD and RCA disease.  He recently was here for evaluation of dyspnea at the request of Dr. Levora AngelBrahmbhatt.   He has been experiencing dyspnea off and on.  Hemoglobin was 14.4.  Father had a stroke.  Former smoker quit in 2019.  Used to smoke about 1-1/2 packs/week.  Occasional discomfort in his left shoulder.  SOB, has to stop and gather himself. With or without exertion. Once per day. Going to mail box. [place].   Odd, maybe tightness. Stopped in store had to breath, subside. Duration 1-5 - 10.  minutes.  Syncope 8 years ago x 1. Outdoor music, bright sensation.   Overall he is concerned about the symptoms.  CT scan did show us LAD and RCA disease with positive FFR analysis.  Showed him pictures personally.  Concerned.  Currently not having any active discomfort.  Denies any bleeding fevers chills nausea vomiting syncope.  04/24/2018 -here for the follow-up of CAD.  Recent LAD intervention atherectomy.  His home blood pressure recordings are very elevated 145/95 for instance 142/103.  Here however his blood pressure was 112/84.  Going to be starting cardiac rehab.  He may only be able to do this for 1 month.  This will help us with his blood pressure readings as  well.  He still having some shortness of breath with activity.  Some discomfort in between his shoulder blades.  We discussed his intervention at length.  11/10/2018- anginal follow-up.  CTO RCA.  Overall has been experiencing less and less angina.  Could not tolerate isosorbide.  Discussed Ranexa but not interested at this time.  Seems to be doing quite well conservatively.  Still having some wrist pain.  He does not recognize any protrusions at this point.  Denies any fevers chills nausea vomiting syncope bleeding.  Had minimally elevated LFTs on lab work.    Past Medical History:  Diagnosis Date  . Chronic GERD   . Gastroesophageal reflux disease without esophagitis   . Hiatal hernia   . Hyperlipidemia   . Hypertension   . Idiopathic gout, right ankle and foot   . Low testosterone   . Reflux   . Tobacco dependence   . Vitamin D deficiency     Past Surgical History:  Procedure Laterality Date  . CORONARY ATHERECTOMY N/A 04/11/2018   Procedure: CORONARY ATHERECTOMY;  Surgeon: Yvonne KendallEnd, Christopher, MD;  Location: [facility];  Service: Cardiovascular;  Laterality: N/A;  . CORONARY STENT INTERVENTION N/A 04/11/2018   Procedure: CORONARY STENT INTERVENTION;  Surgeon: Yvonne KendallEnd, Christopher, MD;  Location: [facility];  Service: Cardiovascular;  Laterality: N/A;  LAD   . INTRAVASCULAR ULTRASOUND/IVUS N/A 04/11/2018  Procedure: Intravascular Ultrasound/IVUS;  Surgeon: Yvonne KendallEnd, Christopher, MD;  Location: [facility];  Service: Cardiovascular;  Laterality: N/A;  LAD  . LEFT HEART CATH AND CORONARY ANGIOGRAPHY N/A 04/07/2018   Procedure: LEFT HEART CATH AND CORONARY ANGIOGRAPHY;  Surgeon: Yvonne KendallEnd, Christopher, MD;  Location: [facility];  Service: Cardiovascular;  Laterality: N/A;    Current Medications: Current Meds  Medication Sig  . aspirin EC 81 MG tablet Take 1 tablet (81 mg total) by mouth daily.  Marland Kitchen. atorvastatin (LIPITOR) 40 MG tablet Take 1 tablet (40 mg total) by mouth  daily.  . clopidogrel (PLAVIX) 75 MG tablet Take 1 tablet (75 mg total) by mouth daily with breakfast.  . metoprolol succinate (TOPROL-XL) 25 MG 24 hr tablet Take 1 tablet (25 mg total) by mouth daily.  . nitroGLYCERIN (NITROSTAT) 0.4 MG SL tablet Place 1 tablet (0.4 mg total) under the tongue every 5 (five) minutes as needed for chest pain.  . pantoprazole (PROTONIX) 40 MG tablet Take 40 mg by mouth daily.     Allergies:   Simcor [niacin-simvastatin er]   Social History   Socioeconomic History  . Marital status: Married    Spouse name: Not on file  . Number of children: Not on file  . Years of education: Not on file  . Highest education level: Not on file  Occupational History  . Occupation: [employer]  Social Needs  . Financial resource strain: Not on file  . Food insecurity    Worry: Not on file    Inability: Not on file  . Transportation needs    Medical: Not on file    Non-medical: Not on file  Tobacco Use  . Smoking status: Former Games developermoker  . Smokeless tobacco: Never Used  Substance and Sexual Activity  . Alcohol use: Yes    Comment: 2 per week  . Drug use: No  . Sexual activity: Not on file  Lifestyle  . Physical activity    Days per week: Not on file    Minutes per session: Not on file  . Stress: Not on file  Relationships  . Social Musicianconnections    Talks on phone: Not on file    Gets together: Not on file    Attends religious service: Not on file    Active member of club or organization: Not on file    Attends meetings of clubs or organizations: Not on file    Relationship status: Not on file  Other Topics Concern  . Not on file  Social History Narrative  . Not on file     Family History: The patient's family history includes CVA in his father; Gout in his father; Hypertension in his father. There is no history of Colon cancer, Colon polyps, or Liver disease.  ROS:   Please see the history of present illness.      All other systems reviewed and  are negative.  EKGs/Labs/Other Studies Reviewed:    The following studies were reviewed today: Office notes lab work EKG CT scan.  I personally reviewed his CT scan analysis with him and his wife.  Showed him pictures of FFR analysis.  ECHO 12/10/17 - Left ventricle: The cavity size was normal. Wall thickness was   normal. Systolic function was normal. The estimated ejection   fraction was in the range of 60% to 65%. Wall motion was normal;   there were no regional wall motion abnormalities. Features are   consistent with a pseudonormal left ventricular  filling pattern,   with concomitant abnormal relaxation and increased filling   pressure (grade 2 diastolic dysfunction).  Cath 04/07/18: Conclusions: 1. Severe two-vessel coronary artery disease, including sequential 80-90% proximal and 70% mid LAD stenoses with heavy calcification, as well as chronic total occlusion of the mid RCA with left-to-right and bridging collaterals. 2. Mild to moderate, non-obstructive LCx/OM disease. 3. Basal inferior hypokinesis with otherwise preserved left ventricular systolic function (LVEF 55-65%). 4. Normal left ventricular filling pressure.  Recommendations: 1. Heart Team discussion regarding optimal revascularization strategy; options in include CABG (LIMA-LAD and SVG-distal RCA) and PCI to proximal/mid LAD (with atherectomy) and medical management of CTO of RCA. 2. Initiate isosorbide mononitrate 30 mg daily for antianginal therapy. 3. Aggressive secondary prevention.    Cath 04/11/18: 1. Severe two-vessel CAD with sequential 80-90% proximal and 70% mid LAD stenoses, as well as CTO of RCA. 2. Normal left ventricular filling pressure. 3. Successful orbital atherectomy and IVUS-guided PCI to LAD using Resolute Onyx 3.5 x 12 mm (proximal) and 3.0 x 18 mm (mid) with 0% residual stenosis and TIMI-3 flow.  Recommendations: 1. Overnight observation. 2. Indefinite dual antiplatelet therapy with aspirin  and clopidogrel. 3. Aggressive secondary prevention and medical management of CTO of RCA.  EKG:  04/02/2018-sinus rhythm 88 possible inferior infarct pattern T wave inversion noted in the precordial leads concerning for ischemia.  Personally reviewed 02/26/2018-sinus rhythm 94 inferior Q waves noted T wave inversion noted in V3 V4 V5 possible anterolateral ischemia.  Recent Labs: 04/12/2018: BUN 8; Creatinine, Ser 0.86; Hemoglobin 13.2; Platelets 256; Potassium 3.9; Sodium 134 09/25/2018: ALT 49  Recent Lipid Panel    Component Value Date/Time   CHOL 127 09/25/2018 0742   TRIG 119 09/25/2018 0742   HDL 46 09/25/2018 0742   CHOLHDL 2.8 09/25/2018 0742   LDLCALC 57 09/25/2018 0742    Physical Exam:    VS:  Wt 149 lb (67.6 kg)   BMI 23.34 kg/m     Wt Readings from Last 3 Encounters:  11/10/18 149 lb (67.6 kg)  09/18/18 154 lb (69.9 kg)  04/24/18 171 lb (77.6 kg)     General-alert and oriented x3 no acute distress Able to complete full sentences without difficulty normal respiratory effort Pleasant.    ASSESSMENT:    1. Coronary artery disease involving native coronary artery of native heart without angina pectoris   2. Mixed hyperlipidemia   3. Angina decubitus (HCC)   4. Essential hypertension    PLAN:    In order of problems listed above:  CAD/Dyspnea/angina/abnormal CT with mid LAD and mid to distal RCA stenosis, positive FFR, had atherectomy to his LAD with stent placement, Dr. Okey Dupre. -Prior longstanding smoking history,  quit.    He has had an echocardiogram that showed normal ejection fraction.  Blood pressure diastolic mildly elevated.  Continue to treat.  He fixes endoscopes.  Uses hands.  His right wrist still bothers him since the cardiac catheterization he states.  I have asked him to do some gentle stretching exercises daily to see if this helps.  He does not feel any "knots "in the region of the calf. -Aspirin 81 mg, metoprolol 25 mg, atorvastatin 40 - Anginal  symptoms seem to have decreased.  Continue with daily exercise.   Hyperlipidemia/minimally elevated LFTs - Atorvastatin 40 mg a day.  LDL 57.  Excellent.  At goal.  His ALT was 49 minimally elevated on 09/25/2018.  We have repeated blood work.  We will see how the results  are.  He states that he was on vacation.  If they are still elevated, we will recheck in about 3 months.  Essential hypertension - Now on metoprolol.  Fairly reasonably controlled.  Try to avoid excessive alcohol.  Abnormal EKG - T wave inversions noted in the precordial leads.  Inferior Q waves noted however there is no evidence of this on echocardiogram.  Given his RCA disease, likely old RCA infarct pattern.  Overall EF reassuring.  Former smoker - This sensation of dyspnea also was accompanying some vaping use at the time.  No longer smoking, no longer vaping.  Trying to do well with cessation    Medication Adjustments/Labs and Tests Ordered: Current medicines are reviewed at length with the patient today.  Concerns regarding medicines are outlined above.  No orders of the defined types were placed in this encounter.  No orders of the defined types were placed in this encounter.   Patient Instructions  Medication Instructions:  Your physician recommends that you continue on your current medications as directed. Please refer to the Current Medication list given to you today.  If you need a refill on your cardiac medications before your next appointment, please call your pharmacy.   Lab work: none If you have labs (blood work) drawn today and your tests are completely normal, you will receive your results only by: Marland Kitchen MyChart Message (if you have MyChart) OR . A paper copy in the mail If you have any lab test that is abnormal or we need to change your treatment, we will call you to review the results.  Testing/Procedures: none  Follow-Up:Your physician wants you to follow-up in: 6 months with Dr. Marlou Porch. You  will receive a reminder letter in the mail two months in advance. If you don't receive a letter, please call our office to schedule the follow-up appointment. [phone].  Any Other Special Instructions Will Be Listed Below (If Applicable)      Signed, Candee Furbish, MD  11/10/2018 10:08 AM    [place]

## 2018-11-10 NOTE — Telephone Encounter (Signed)
Received a call from [place], RN this morning that per  Patients wife, they received message in regards to a virtual appointment with Dr Kingsley Plan today on Saturday.  His wife said he was not happy about it and was coming here anyway.  I was going to call the patient and explain that Dr Marlou Porch is not in the office today and they why behind needing to be a virtual appointment but he does not have a phone per wife.  When he arrives I will be glad to come downstairs and explain the why and get him rescheduled to a day when Dr. Marlou Porch is onsite.

## 2018-11-10 NOTE — Telephone Encounter (Signed)
Virtual Visit Pre-Appointment Phone Call  "(Name), I am calling you today to discuss your upcoming appointment. We are currently trying to limit exposure to the virus that causes COVID-19 by seeing patients at home rather than in the office."  1. "What is the BEST phone number to call the day of the visit?" - include this in appointment notes  2. Do you have or have access to (through a family member/friend) a smartphone with video capability that we can use for your visit?" a. If yes - list this number in appt notes as cell (if different from BEST phone #) and list the appointment type as a VIDEO visit in appointment notes b. If no - list the appointment type as a PHONE visit in appointment notes  3. Confirm consent - "In the setting of the current Covid19 crisis, you are scheduled for a (phone or video) visit with your provider on (date) at (time).  Just as we do with many in-office visits, in order for you to participate in this visit, we must obtain consent.  If you'd like, I can send this to your mychart (if signed up) or email for you to review.  Otherwise, I can obtain your verbal consent now.  All virtual visits are billed to your insurance company just like a normal visit would be.  By agreeing to a virtual visit, we'd like you to understand that the technology does not allow for your provider to perform an examination, and thus may limit your provider's ability to fully assess your condition. If your provider identifies any concerns that need to be evaluated in person, we will make arrangements to do so.  Finally, though the technology is pretty good, we cannot assure that it will always work on either your or our end, and in the setting of a video visit, we may have to convert it to a phone-only visit.  In either situation, we cannot ensure that we have a secure connection.  Are you willing to proceed?" STAFF: Did the patient verbally acknowledge consent to telehealth visit? Document  YES/NO here: yes  4. Advise patient to be prepared - "Two hours prior to your appointment, go ahead and check your blood pressure, pulse, oxygen saturation, and your weight (if you have the equipment to check those) and write them all down. When your visit starts, your provider will ask you for this information. If you have an Apple Watch or Kardia device, please plan to have heart rate information ready on the day of your appointment. Please have a pen and paper handy nearby the day of the visit as well."  5. Give patient instructions for MyChart download to smartphone OR [email or web address] as below if video visit (depending on what platform provider is using)  6. Inform patient they will receive a phone call 15 minutes prior to their appointment time (may be from unknown caller ID) so they should be prepared to answer    TELEPHONE CALL NOTE  Brent BuccoDennis Carson has been deemed a candidate for a follow-up tele-health visit to limit community exposure during the Covid-19 pandemic. I spoke with the patient via phone to ensure availability of phone/video source, confirm preferred email & phone number, and discuss instructions and expectations.  I reminded Brent BuccoDennis Carson to be prepared with any vital sign and/or heart rhythm information that could potentially be obtained via home monitoring, at the time of his visit. I reminded Brent BuccoDennis Carson to expect a phone call prior to his visit.  Stephani Police, RN 11/10/2018 9:31 AM     FULL LENGTH CONSENT FOR TELE-HEALTH VISIT   I hereby voluntarily request, consent and authorize [facility] and its employed or contracted physicians, physician assistants, nurse practitioners or other licensed health care professionals (the Practitioner), to provide me with telemedicine health care services (the Services") as deemed necessary by the treating Practitioner. I acknowledge and consent to receive the Services by the Practitioner via telemedicine. I understand that the  telemedicine visit will involve communicating with the Practitioner through live audiovisual communication technology and the disclosure of certain medical information by electronic transmission. I acknowledge that I have been given the opportunity to request an in-person assessment or other available alternative prior to the telemedicine visit and am voluntarily participating in the telemedicine visit.  I understand that I have the right to withhold or withdraw my consent to the use of telemedicine in the course of my care at any time, without affecting my right to future care or treatment, and that the Practitioner or I may terminate the telemedicine visit at any time. I understand that I have the right to inspect all information obtained and/or recorded in the course of the telemedicine visit and may receive copies of available information for a reasonable fee.  I understand that some of the potential risks of receiving the Services via telemedicine include:   Delay or interruption in medical evaluation due to technological equipment failure or disruption;  Information transmitted may not be sufficient (e.g. poor resolution of images) to allow for appropriate medical decision making by the Practitioner; and/or   In rare instances, security protocols could fail, causing a breach of personal health information.  Furthermore, I acknowledge that it is my responsibility to provide information about my medical history, conditions and care that is complete and accurate to the best of my ability. I acknowledge that Practitioner's advice, recommendations, and/or decision may be based on factors not within their control, such as incomplete or inaccurate data provided by me or distortions of diagnostic images or specimens that may result from electronic transmissions. I understand that the practice of medicine is not an exact science and that Practitioner makes no warranties or guarantees regarding treatment  outcomes. I acknowledge that I will receive a copy of this consent concurrently upon execution via email to the email address I last provided but may also request a printed copy by calling the office of [facility].    I understand that my insurance will be billed for this visit.   I have read or had this consent read to me.  I understand the contents of this consent, which adequately explains the benefits and risks of the Services being provided via telemedicine.   I have been provided ample opportunity to ask questions regarding this consent and the Services and have had my questions answered to my satisfaction.  I give my informed consent for the services to be provided through the use of telemedicine in my medical care  By participating in this telemedicine visit I agree to the above.

## 2018-12-25 ENCOUNTER — Other Ambulatory Visit: Payer: Self-pay | Admitting: Cardiology

## 2018-12-26 ENCOUNTER — Other Ambulatory Visit: Payer: Self-pay | Admitting: Cardiology

## 2018-12-29 ENCOUNTER — Other Ambulatory Visit: Payer: Self-pay

## 2019-01-01 NOTE — Telephone Encounter (Signed)
Continue plavix Thanks Candee Furbish, MD

## 2019-06-29 ENCOUNTER — Other Ambulatory Visit: Payer: Self-pay | Admitting: Cardiology
